# Patient Record
Sex: Female | Born: 1990 | Hispanic: No | Marital: Single | Smoking: Current every day smoker
Health system: Southern US, Community
[De-identification: ages and names within clinical notes are randomized; demographics above are authoritative.]

## PROBLEM LIST (undated history)

## (undated) HISTORY — PX: APPENDECTOMY: SHX54

---
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
## 2013-10-11 ENCOUNTER — Inpatient Hospital Stay (HOSPITAL_COMMUNITY)
Admission: EM | Admit: 2013-10-11 | Discharge: 2013-10-16 | DRG: 867 | Disposition: A | Discharge status: Home / Self Care | Payer: Medicaid - Out of State | Attending: Internal Medicine | Admitting: Internal Medicine

## 2013-10-11 ENCOUNTER — Emergency Department (HOSPITAL_COMMUNITY): Payer: Medicaid - Out of State

## 2013-10-11 ENCOUNTER — Encounter (HOSPITAL_COMMUNITY): Payer: Self-pay | Admitting: Emergency Medicine

## 2013-10-11 DIAGNOSIS — F17200 Nicotine dependence, unspecified, uncomplicated: Secondary | ICD-10-CM | POA: Diagnosis present

## 2013-10-11 DIAGNOSIS — L03119 Cellulitis of unspecified part of limb: Secondary | ICD-10-CM

## 2013-10-11 DIAGNOSIS — IMO0002 Reserved for concepts with insufficient information to code with codable children: Secondary | ICD-10-CM | POA: Diagnosis present

## 2013-10-11 DIAGNOSIS — E669 Obesity, unspecified: Secondary | ICD-10-CM | POA: Diagnosis present

## 2013-10-11 DIAGNOSIS — Z6841 Body Mass Index (BMI) 40.0 and over, adult: Secondary | ICD-10-CM

## 2013-10-11 DIAGNOSIS — A419 Sepsis, unspecified organism: Secondary | ICD-10-CM | POA: Diagnosis present

## 2013-10-11 DIAGNOSIS — L0390 Cellulitis, unspecified: Secondary | ICD-10-CM

## 2013-10-11 DIAGNOSIS — L0291 Cutaneous abscess, unspecified: Secondary | ICD-10-CM | POA: Diagnosis present

## 2013-10-11 DIAGNOSIS — A429 Actinomycosis, unspecified: Principal | ICD-10-CM | POA: Diagnosis present

## 2013-10-11 DIAGNOSIS — L03113 Cellulitis of right upper limb: Secondary | ICD-10-CM

## 2013-10-11 LAB — CBC WITH DIFFERENTIAL/PLATELET
BASOS PCT: 0 % (ref 0–1)
Basophils Absolute: 0 10*3/uL (ref 0.0–0.1)
Eosinophils Absolute: 0.1 10*3/uL (ref 0.0–0.7)
Eosinophils Relative: 1 % (ref 0–5)
HCT: 32.8 % — ABNORMAL LOW (ref 36.0–46.0)
Hemoglobin: 10.2 g/dL — ABNORMAL LOW (ref 12.0–15.0)
LYMPHS PCT: 30 % (ref 12–46)
Lymphs Abs: 4 10*3/uL (ref 0.7–4.0)
MCH: 25 pg — ABNORMAL LOW (ref 26.0–34.0)
MCHC: 31.1 g/dL (ref 30.0–36.0)
MCV: 80.4 fL (ref 78.0–100.0)
Monocytes Absolute: 0.9 10*3/uL (ref 0.1–1.0)
Monocytes Relative: 7 % (ref 3–12)
Neutro Abs: 8.5 10*3/uL — ABNORMAL HIGH (ref 1.7–7.7)
Neutrophils Relative %: 62 % (ref 43–77)
Platelets: 346 10*3/uL (ref 150–400)
RBC: 4.08 MIL/uL (ref 3.87–5.11)
RDW: 14.9 % (ref 11.5–15.5)
WBC: 13.5 10*3/uL — ABNORMAL HIGH (ref 4.0–10.5)

## 2013-10-11 LAB — I-STAT CG4 LACTIC ACID, ED: LACTIC ACID, VENOUS: 0.63 mmol/L (ref 0.5–2.2)

## 2013-10-11 MED ORDER — ACETAMINOPHEN 325 MG PO TABS
650.0000 mg | ORAL_TABLET | Freq: Four times a day (QID) | ORAL | Status: DC | PRN
Start: 2013-10-11 — End: 2013-10-16
  Administered 2013-10-11: 650 mg via ORAL
  Filled 2013-10-11: qty 2

## 2013-10-11 MED ORDER — HYDROMORPHONE HCL PF 1 MG/ML IJ SOLN
1.0000 mg | Freq: Once | INTRAMUSCULAR | Status: AC
  Administered 2013-10-11: 1 mg via INTRAVENOUS
  Filled 2013-10-11: qty 1

## 2013-10-11 MED ORDER — LIDOCAINE-EPINEPHRINE 1 %-1:100000 IJ SOLN
10.0000 mL | Freq: Once | INTRAMUSCULAR | Status: DC
  Filled 2013-10-11: qty 1

## 2013-10-11 NOTE — ED Notes (Signed)
Pt has large abscess under right arm x 1 week. Pt has 10/10 pain. Pt reports frequent chil.s. Denies N/V/D. AO x4.

## 2013-10-12 ENCOUNTER — Encounter (HOSPITAL_COMMUNITY): Payer: Self-pay | Admitting: *Deleted

## 2013-10-12 ENCOUNTER — Inpatient Hospital Stay (HOSPITAL_COMMUNITY): Payer: Medicaid - Out of State

## 2013-10-12 DIAGNOSIS — IMO0002 Reserved for concepts with insufficient information to code with codable children: Secondary | ICD-10-CM

## 2013-10-12 DIAGNOSIS — A429 Actinomycosis, unspecified: Secondary | ICD-10-CM | POA: Diagnosis present

## 2013-10-12 DIAGNOSIS — L0291 Cutaneous abscess, unspecified: Secondary | ICD-10-CM | POA: Diagnosis not present

## 2013-10-12 DIAGNOSIS — A419 Sepsis, unspecified organism: Secondary | ICD-10-CM | POA: Diagnosis present

## 2013-10-12 DIAGNOSIS — L0390 Cellulitis, unspecified: Secondary | ICD-10-CM | POA: Diagnosis not present

## 2013-10-12 DIAGNOSIS — F17200 Nicotine dependence, unspecified, uncomplicated: Secondary | ICD-10-CM | POA: Diagnosis present

## 2013-10-12 DIAGNOSIS — E669 Obesity, unspecified: Secondary | ICD-10-CM | POA: Diagnosis present

## 2013-10-12 DIAGNOSIS — L03119 Cellulitis of unspecified part of limb: Secondary | ICD-10-CM | POA: Diagnosis present

## 2013-10-12 DIAGNOSIS — Z6841 Body Mass Index (BMI) 40.0 and over, adult: Secondary | ICD-10-CM | POA: Diagnosis not present

## 2013-10-12 DIAGNOSIS — R509 Fever, unspecified: Secondary | ICD-10-CM | POA: Diagnosis present

## 2013-10-12 LAB — URINE MICROSCOPIC-ADD ON

## 2013-10-12 LAB — COMPREHENSIVE METABOLIC PANEL
ALK PHOS: 78 U/L (ref 39–117)
ALT: 11 U/L (ref 0–35)
AST: 14 U/L (ref 0–37)
Albumin: 3.1 g/dL — ABNORMAL LOW (ref 3.5–5.2)
BUN: 8 mg/dL (ref 6–23)
CHLORIDE: 100 meq/L (ref 96–112)
CO2: 23 mEq/L (ref 19–32)
Calcium: 9.1 mg/dL (ref 8.4–10.5)
Creatinine, Ser: 0.66 mg/dL (ref 0.50–1.10)
GFR calc non Af Amer: >90 mL/min (ref >90)
GLUCOSE: 103 mg/dL — AB (ref 70–99)
POTASSIUM: 3.9 meq/L (ref 3.7–5.3)
SODIUM: 138 meq/L (ref 137–147)
Total Bilirubin: <0.2 mg/dL — ABNORMAL LOW (ref 0.3–1.2)
Total Protein: 7.1 g/dL (ref 6.0–8.3)

## 2013-10-12 LAB — BASIC METABOLIC PANEL
BUN: 9 mg/dL (ref 6–23)
CHLORIDE: 98 meq/L (ref 96–112)
CO2: 22 meq/L (ref 19–32)
CREATININE: 0.65 mg/dL (ref 0.50–1.10)
Calcium: 9.4 mg/dL (ref 8.4–10.5)
GFR calc Af Amer: >90 mL/min (ref >90)
GFR calc non Af Amer: >90 mL/min (ref >90)
GLUCOSE: 105 mg/dL — AB (ref 70–99)
Potassium: 3.9 mEq/L (ref 3.7–5.3)
Sodium: 136 mEq/L — ABNORMAL LOW (ref 137–147)

## 2013-10-12 LAB — CBC WITH DIFFERENTIAL/PLATELET
Basophils Absolute: 0 10*3/uL (ref 0.0–0.1)
Basophils Relative: 0 % (ref 0–1)
EOS ABS: 0.1 10*3/uL (ref 0.0–0.7)
Eosinophils Relative: 1 % (ref 0–5)
HCT: 28.9 % — ABNORMAL LOW (ref 36.0–46.0)
Hemoglobin: 9.2 g/dL — ABNORMAL LOW (ref 12.0–15.0)
LYMPHS ABS: 2.9 10*3/uL (ref 0.7–4.0)
Lymphocytes Relative: 24 % (ref 12–46)
MCH: 25.6 pg — AB (ref 26.0–34.0)
MCHC: 31.8 g/dL (ref 30.0–36.0)
MCV: 80.5 fL (ref 78.0–100.0)
Monocytes Absolute: 0.9 10*3/uL (ref 0.1–1.0)
Monocytes Relative: 7 % (ref 3–12)
NEUTROS ABS: 8.2 10*3/uL — AB (ref 1.7–7.7)
NEUTROS PCT: 68 % (ref 43–77)
PLATELETS: 288 10*3/uL (ref 150–400)
RBC: 3.59 MIL/uL — ABNORMAL LOW (ref 3.87–5.11)
RDW: 15.2 % (ref 11.5–15.5)
WBC: 12.1 10*3/uL — ABNORMAL HIGH (ref 4.0–10.5)

## 2013-10-12 LAB — URINALYSIS, ROUTINE W REFLEX MICROSCOPIC
Bilirubin Urine: NEGATIVE
Glucose, UA: NEGATIVE mg/dL
Hgb urine dipstick: NEGATIVE
KETONES UR: 15 mg/dL — AB
Nitrite: NEGATIVE
PROTEIN: NEGATIVE mg/dL
Specific Gravity, Urine: 1.031 — ABNORMAL HIGH (ref 1.005–1.030)
UROBILINOGEN UA: 0.2 mg/dL (ref 0.0–1.0)
pH: 5.5 (ref 5.0–8.0)

## 2013-10-12 LAB — CK: Total CK: 98 U/L (ref 7–177)

## 2013-10-12 LAB — PROTIME-INR
INR: 1.15 (ref 0.00–1.49)
Prothrombin Time: 14.7 seconds (ref 11.6–15.2)

## 2013-10-12 LAB — PREGNANCY, URINE: Preg Test, Ur: NEGATIVE

## 2013-10-12 MED ORDER — HYDROMORPHONE HCL PF 1 MG/ML IJ SOLN
0.5000 mg | INTRAMUSCULAR | Status: DC | PRN
  Administered 2013-10-13 – 2013-10-14 (×8): 0.5 mg via INTRAVENOUS
  Administered 2013-10-14: 1 mg via INTRAVENOUS
  Administered 2013-10-15 (×2): 0.5 mg via INTRAVENOUS
  Filled 2013-10-12 (×12): qty 1

## 2013-10-12 MED ORDER — ACETAMINOPHEN 650 MG RE SUPP: 650.0000 mg | Freq: Four times a day (QID) | RECTAL | Status: DC | PRN

## 2013-10-12 MED ORDER — HYDROCODONE-ACETAMINOPHEN 5-325 MG PO TABS
1.0000 | ORAL_TABLET | ORAL | Status: DC | PRN
  Administered 2013-10-12 (×4): 2 via ORAL
  Filled 2013-10-12 (×4): qty 2

## 2013-10-12 MED ORDER — ACETAMINOPHEN 325 MG PO TABS: 650.0000 mg | ORAL_TABLET | Freq: Four times a day (QID) | ORAL | Status: DC | PRN

## 2013-10-12 MED ORDER — PIPERACILLIN-TAZOBACTAM 3.375 G IVPB
3.3750 g | Freq: Three times a day (TID) | INTRAVENOUS | Status: DC
  Administered 2013-10-12 – 2013-10-14 (×7): 3.375 g via INTRAVENOUS
  Filled 2013-10-12 (×11): qty 50

## 2013-10-12 MED ORDER — ENOXAPARIN SODIUM 60 MG/0.6ML ~~LOC~~ SOLN
60.0000 mg | SUBCUTANEOUS | Status: DC
  Filled 2013-10-12: qty 0.6

## 2013-10-12 MED ORDER — SODIUM CHLORIDE 0.9 % IV SOLN
Freq: Once | INTRAVENOUS | Status: AC
  Administered 2013-10-12: 01:00:00 via INTRAVENOUS

## 2013-10-12 MED ORDER — ONDANSETRON HCL 4 MG/2ML IJ SOLN: 4.0000 mg | Freq: Four times a day (QID) | INTRAMUSCULAR | Status: DC | PRN

## 2013-10-12 MED ORDER — SODIUM CHLORIDE 0.9 % IV SOLN
INTRAVENOUS | Status: DC
  Administered 2013-10-12 – 2013-10-14 (×3): via INTRAVENOUS

## 2013-10-12 MED ORDER — CLINDAMYCIN PHOSPHATE 600 MG/50ML IV SOLN
600.0000 mg | Freq: Once | INTRAVENOUS | Status: AC
  Administered 2013-10-12: 600 mg via INTRAVENOUS
  Filled 2013-10-12: qty 50

## 2013-10-12 MED ORDER — ENOXAPARIN SODIUM 40 MG/0.4ML ~~LOC~~ SOLN
40.0000 mg | Freq: Every day | SUBCUTANEOUS | Status: DC
  Administered 2013-10-12: 40 mg via SUBCUTANEOUS
  Filled 2013-10-12: qty 0.4

## 2013-10-12 MED ORDER — ONDANSETRON HCL 4 MG PO TABS: 4.0000 mg | ORAL_TABLET | Freq: Four times a day (QID) | ORAL | Status: DC | PRN

## 2013-10-12 MED ORDER — HYDROCODONE-ACETAMINOPHEN 5-325 MG PO TABS
1.0000 | ORAL_TABLET | ORAL | Status: DC | PRN
  Administered 2013-10-12 – 2013-10-15 (×10): 2 via ORAL
  Filled 2013-10-12 (×10): qty 2

## 2013-10-12 MED ORDER — HYDROMORPHONE HCL PF 1 MG/ML IJ SOLN
1.0000 mg | Freq: Once | INTRAMUSCULAR | Status: AC
Start: 2013-10-12 — End: 2013-10-12
  Administered 2013-10-12: 1 mg via INTRAVENOUS
  Filled 2013-10-12: qty 1

## 2013-10-12 MED ORDER — CLINDAMYCIN PHOSPHATE 600 MG/50ML IV SOLN
600.0000 mg | Freq: Three times a day (TID) | INTRAVENOUS | Status: DC
  Administered 2013-10-12: 600 mg via INTRAVENOUS
  Filled 2013-10-12 (×3): qty 50

## 2013-10-12 NOTE — ED Provider Notes (Signed)
CSN: [id number]     Arrival date & time 10/11/13  2242 History   First MD Initiated Contact with Patient 10/11/13 2307     Chief Complaint  Patient presents with  . Abscess     (Consider location/radiation/quality/duration/timing/severity/associated sxs/prior Treatment) HPI 23 year old female presents to emergency apartment from home with complaint of abscess under her right arm.  Swelling has been ongoing for the past week, but is worse in the last 2 days.  Area is exquisitely tender.  Patient with fever here tachycardia.  No prior history of any medical problems.  No medications.  Patient is a smoker.  She reports history of abscesses in the past that resolved on their own.  Patient has been applying a heating pad.  She reports that she has been wrapping a towel and applying abdominal more than 30 minutes at a time. History reviewed. No pertinent past medical history. Past Surgical History  Procedure Laterality Date  . Appendectomy     No family history on file. History  Substance Use Topics  . Smoking status: Current Every Day Smoker -- 0.50 packs/day    Types: Cigarettes  . Smokeless tobacco: Not on file  . Alcohol Use: No   OB History   Grav Para Term Preterm Abortions TAB SAB Ect Mult Living            1     Review of Systems  See History of Present Illness; otherwise all other systems are reviewed and negative   Allergies  Review of patient's allergies indicates no known allergies.  Home Medications   Prior to Admission medications   Medication Sig Start Date End Date Taking? Authorizing Provider  ibuprofen (ADVIL,MOTRIN) 200 MG tablet Take 600 mg by mouth 3 (three) times daily.   Yes Historical Provider, MD   BP 109/55  Pulse 140  Temp(Src) 101.8 F (38.8 C) (Rectal)  Resp 22  Ht 5\' 4"  (1.626 m)  SpO2 98%  LMP 10/02/2013 Physical Exam  Nursing note and vitals reviewed. Constitutional: She is oriented to person, place, and time. She appears well-developed  and well-nourished. She appears distressed.  Obese female, in acute pain tearful  HENT:  Head: Normocephalic and atraumatic.  Nose: Nose normal.  Mouth/Throat: Oropharynx is clear and moist.  Eyes: Conjunctivae and EOM are normal. Pupils are equal, round, and reactive to light.  Neck: Normal range of motion. Neck supple. No JVD present. No tracheal deviation present. No thyromegaly present.  Cardiovascular: Regular rhythm, normal heart sounds and intact distal pulses.  Exam reveals no gallop and no friction rub.   No murmur heard. Tachycardia  Pulmonary/Chest: Effort normal and breath sounds normal. No stridor. No respiratory distress. She has no wheezes. She has no rales. She exhibits no tenderness.  Abdominal: Soft. Bowel sounds are normal. She exhibits no distension and no mass. There is no tenderness. There is no rebound and no guarding.  Musculoskeletal: Normal range of motion. She exhibits edema and tenderness.  The patient has significant edema and tenderness to her right upper arm.  She has a 4" x 6" area of raised erythematous warm skin.  Bedside ultrasound shows no fluid collection  Lymphadenopathy:    She has no cervical adenopathy.  Neurological: She is alert and oriented to person, place, and time. She exhibits normal muscle tone. Coordination normal.  Skin: Skin is dry. No rash noted. No erythema. No pallor.  Psychiatric: She has a normal mood and affect. Her behavior is normal. Judgment and thought content  normal.    ED Course  Procedures (including critical care time) Labs Review Labs Reviewed  CBC WITH DIFFERENTIAL - Abnormal; Notable for the following:    WBC 13.5 (*)    Hemoglobin 10.2 (*)    HCT 32.8 (*)    MCH 25.0 (*)    Neutro Abs 8.5 (*)    All other components within normal limits  BASIC METABOLIC PANEL - Abnormal; Notable for the following:    Sodium 136 (*)    Glucose, Bld 105 (*)    All other components within normal limits  URINE CULTURE  CULTURE,  ROUTINE-ABSCESS  CULTURE, BLOOD (ROUTINE X 2)  CULTURE, BLOOD (ROUTINE X 2)  URINALYSIS, ROUTINE W REFLEX MICROSCOPIC  I-STAT CG4 LACTIC ACID, ED  POC URINE PREG, ED    Imaging Review No results found.   EKG Interpretation None      MDM   Final diagnoses:  Cellulitis of right upper extremity    23 year old female with an odd appearing cellulitic area to her right upper extremity.  No abscess noted to drain.  Pictures to be included in the chart.  Given that she is systemically ill, will start antibiotics and admit to the hospital for close monitoring.        Olivia Mackielga M Otter, MD 10/12/13 [phone]

## 2013-10-12 NOTE — Progress Notes (Signed)
ANTIBIOTIC CONSULT NOTE - INITIAL  Pharmacy Consult:  Zosyn Indication:  Cellulitis  No Known Allergies  Patient Measurements: Height: 5\' 4"  (162.6 cm) Weight: 253 lb 14.4 oz (115.168 kg) IBW/kg (Calculated) : 54.7  Vital Signs: Temp: 98.4 F (36.9 C) (06/25 0629) Temp src: Oral (06/25 0629) BP: 115/61 mmHg (06/25 0629) Pulse Rate: 102 (06/25 0629)  Labs:  Recent Labs  10/11/13 2313 10/12/13 0300  WBC 13.5* 12.1*  HGB 10.2* 9.2*  PLT 346 288  CREATININE 0.65 0.66   Estimated Creatinine Clearance: 136.2 ml/min (by C-G formula based on Cr of 0.66). No results found for this basename: VANCOTROUGH, VANCOPEAK, VANCORANDOM, GENTTROUGH, GENTPEAK, GENTRANDOM, TOBRATROUGH, TOBRAPEAK, TOBRARND, AMIKACINPEAK, AMIKACINTROU, AMIKACIN,  in the last 72 hours   Microbiology: No results found for this or any previous visit (from the past 720 hour(s)).  Medical History: History reviewed. No pertinent past medical history.    Assessment: [age] YOF with a one week history of right axilla area swelling and redness associated with fever and chills.  Pharmacy consulted to initiate Zosyn for cellulitis.  Baseline labs reviewed.   Goal of Therapy:  Clearance of infection   Plan:  - Zosyn 3.375gm IV Q8H, 4 hr infusion - Pharmacy will sign off as dosage adjustment likely unnecessary.  Thank you for the consult! - Change Lovenox to 60mg  SQ Q24H for VTE px in patient with BMI > 30    Delancey Moraes D. Laney Potashang, PharmD, BCPS Pager:  [phone] 10/12/2013, 11:10 AM

## 2013-10-12 NOTE — Progress Notes (Signed)
Utilization review completed.  

## 2013-10-12 NOTE — Progress Notes (Signed)
[facility] PROGRESS NOTE  Sheran SpineKia Kolodziej ZOX:[id number] DOB: Oct 04, 1990 DOA: 10/11/2013 PCP: No PCP Per Patient  Assessment/Plan: 23 y/o female presented with right axilla area swelling, erythema, associated with fever chills for one week. -denies any discharge drainage trauma injury, but reported h/o mild prior similar episode last year   1. Cellulitis R UE, sirs/sepsis; -started IV atx(changed to zosyn), f/u cultures; obtain US r/o abscess;    LMP ended on 15 June, denies being pregnant; pend pregnancy test   Code Status: full Family Communication:  Family at the bedside (indicate person spoken with, relationship, and if by phone, the number) Disposition Plan: home 2-3 days, pend clinical improvement    Consultants:  none  Procedures:  pedn US  Antibiotics:  clinda 6/25<<6/25  Zosyn 6/25<<<<<   (indicate start date, and stop date if known)  HPI/Subjective: alert  Objective: Filed Vitals:   10/12/13 0629  BP: 115/61  Pulse: 102  Temp: 98.4 F (36.9 C)  Resp: 20   No intake or output data in the 24 hours ending 10/12/13 1056 Filed Weights   10/12/13 0629  Weight: 115.168 kg (253 lb 14.4 oz)    Exam:   General:  alert  Cardiovascular: s1,s2 rrr  Respiratory: CTA BL  Abdomen: soft, nt,nd   Musculoskeletal: R axilla edema, erythema, ROM restricted due to pain   Data Reviewed: Basic Metabolic Panel:  Recent Labs Lab 10/11/13 2313 10/12/13 0300  NA 136* 138  K 3.9 3.9  CL 98 100  CO2 22 23  GLUCOSE 105* 103*  BUN 9 8  CREATININE 0.65 0.66  CALCIUM 9.4 9.1   Liver Function Tests:  Recent Labs Lab 10/12/13 0300  AST 14  ALT 11  ALKPHOS 78  BILITOT <0.2*  PROT 7.1  ALBUMIN 3.1*   No results found for this basename: LIPASE, AMYLASE,  in the last 168 hours No results found for this basename: AMMONIA,  in the last 168 hours CBC:  Recent Labs Lab 10/11/13 2313 10/12/13 0300  WBC 13.5* 12.1*  NEUTROABS 8.5* 8.2*  HGB  10.2* 9.2*  HCT 32.8* 28.9*  MCV 80.4 80.5  PLT 346 288   Cardiac Enzymes:  Recent Labs Lab 10/12/13 0300  CKTOTAL 98   BNP (last 3 results) No results found for this basename: PROBNP,  in the last 8760 hours CBG: No results found for this basename: GLUCAP,  in the last 168 hours  No results found for this or any previous visit (from the past 240 hour(s)).   Studies: No results found.  Scheduled Meds: . clindamycin (CLEOCIN) IV  600 mg Intravenous 3 times per day  . enoxaparin (LOVENOX) injection  40 mg Subcutaneous Daily   Continuous Infusions: . sodium chloride 100 mL/hr at 10/12/13 0749    Principal Problem:   Cellulitis of upper arm    Time spent: >35 minutes     Esperanza SheetsBURIEV, ULUGBEK N  [facility] Pager [phone]. If 7PM-7AM, please contact night-coverage at [email or web address], password [email or web address] 10/12/2013, 10:56 AM  LOS: 1 day

## 2013-10-12 NOTE — H&P (Signed)
[facility] History and Physical  Patient: Sierra Palmer  LKT:[id number]  DOB: [birth year]  DOS: the patient was seen and examined on 10/12/2013 PCP: No PCP Per Patient  Chief Complaint: Right axilla swelling  HPI: Sierra Palmer is a 23 y.o. female with no significant Past medical history. Patient presented with complaints of swelling that she has noted in her right axilla since last one week. She mentions she initially noted a node and later on she started having some pain in the same area and it was progressively worsening to the extent of current presentation. She denies any discharge drainage trauma injury or prior infection. She today he started having complaints of fever with chills and generalized fatigue and malaise and therefore, the hospital. She mentions once she had a nodule in the same spot which resolved on its own after taking over-the-counter pain medications. Last menstrual period ended on 15 June.  The patient is coming from home. And at her baseline independent for most of her ADL.  Review of Systems: as mentioned in the history of present illness.  A Comprehensive review of the other systems is negative.  History reviewed. No pertinent past medical history. Past Surgical History  Procedure Laterality Date  . Appendectomy     Social History:  reports that she has been smoking Cigarettes.  She has been smoking about 0.50 packs per day. She does not have any smokeless tobacco history on file. She reports that she does not drink alcohol. Her drug history is not on file.  No Known Allergies  History reviewed. No pertinent family history.  Prior to Admission medications   Medication Sig Start Date End Date Taking? Authorizing Provider  ibuprofen (ADVIL,MOTRIN) 200 MG tablet Take 600 mg by mouth 3 (three) times daily.   Yes Historical Provider, MD    Physical Exam: Filed Vitals:   10/11/13 2308 10/12/13 0130 10/12/13 0131 10/12/13 0218  BP:  90/38 106/66 113/54   Pulse:  113 115 110  Temp: 101.8 F (38.8 C)  100.2 F (37.9 C) 99.1 F (37.3 C)  TempSrc: Rectal  Oral Oral  Resp:   16 15  Height:      SpO2:  98% 99% 99%    General: Alert, Awake and Oriented to Time, Place and Person. Appear in mild distress Eyes: PERRL ENT: Oral Mucosa clear moist Neck: no JVD Cardiovascular: S1 and S2 Present, no Murmur, Peripheral Pulses Present Respiratory: Bilateral Air entry equal and Decreased, Clear to Auscultation,  no Crackles,no wheezes Abdomen: Bowel Sound Present, Soft and Non tender Skin: no Rash Extremities: Right axillary swelling with warmth and tenderness Limited range of motion at shoulder with pain in the lower axillary area No Pedal edema, no calf tenderness Neurologic: Grossly no focal neuro deficit.  Labs on Admission:  CBC:  Recent Labs Lab 10/11/13 2313 10/12/13 0300  WBC 13.5* 12.1*  NEUTROABS 8.5* 8.2*  HGB 10.2* 9.2*  HCT 32.8* 28.9*  MCV 80.4 80.5  PLT 346 288    CMP     Component Value Date/Time   NA 138 10/12/2013 0300   K 3.9 10/12/2013 0300   CL 100 10/12/2013 0300   CO2 23 10/12/2013 0300   GLUCOSE 103* 10/12/2013 0300   BUN 8 10/12/2013 0300   CREATININE 0.66 10/12/2013 0300   CALCIUM 9.1 10/12/2013 0300   PROT 7.1 10/12/2013 0300   ALBUMIN 3.1* 10/12/2013 0300   AST 14 10/12/2013 0300   ALT 11 10/12/2013 0300   ALKPHOS 78 10/12/2013 0300  BILITOT <0.2* 10/12/2013 0300   GFRNONAA >90 10/12/2013 0300   GFRAA >90 10/12/2013 0300    No results found for this basename: LIPASE, AMYLASE,  in the last 168 hours No results found for this basename: AMMONIA,  in the last 168 hours   Recent Labs Lab 10/12/13 0300  CKTOTAL 98   BNP (last 3 results) No results found for this basename: PROBNP,  in the last 8760 hours  Radiological Exams on Admission: No results found.   Assessment/Plan Principal Problem:   Cellulitis of upper arm   1. Cellulitis of upper arm Patient presents with complaints of significant  swelling in the right exit 81 with a temperature of 103. At present with his significant presentation the patient will be admitted to the hospital for observation. Blood cultures ESR CRP CPK. Ultrasound of the local labia for possible pus. IV clindamycin and IV hydration.  DVT Prophylaxis: subcutaneous Heparin Nutrition: Regular diet  Code Status: Full  Family Communication: Significant other was present at bedside, opportunity was given to ask question and all questions were answered satisfactorily at the time of interview. Disposition: Admitted to observation in med-surge unit.  Author: Pranav Patel, MD [facility] Pager: [phone] 10/12/2013, 5:53 AM    If 7PM-7AM, please contact night-coverage [email or web address] Password [email or web address]  **Disclaimer: This note may have been dictated with voice recognition software. Similar sounding words can inadvertently be transcribed and this note may contain transcription errors which may not have been corrected upon publication of note.**   

## 2013-10-13 ENCOUNTER — Encounter (HOSPITAL_COMMUNITY): Payer: Medicaid - Out of State | Admitting: Certified Registered Nurse Anesthetist

## 2013-10-13 ENCOUNTER — Encounter (HOSPITAL_COMMUNITY)
Admission: EM | Disposition: A | Discharge status: Home / Self Care | Payer: Self-pay | Source: Home / Self Care | Attending: Internal Medicine

## 2013-10-13 ENCOUNTER — Encounter (HOSPITAL_COMMUNITY): Payer: Self-pay | Admitting: General Surgery

## 2013-10-13 ENCOUNTER — Inpatient Hospital Stay (HOSPITAL_COMMUNITY): Payer: Medicaid - Out of State | Admitting: Certified Registered Nurse Anesthetist

## 2013-10-13 DIAGNOSIS — L0390 Cellulitis, unspecified: Secondary | ICD-10-CM

## 2013-10-13 DIAGNOSIS — IMO0002 Reserved for concepts with insufficient information to code with codable children: Secondary | ICD-10-CM

## 2013-10-13 DIAGNOSIS — L0291 Cutaneous abscess, unspecified: Secondary | ICD-10-CM

## 2013-10-13 DIAGNOSIS — A419 Sepsis, unspecified organism: Secondary | ICD-10-CM

## 2013-10-13 HISTORY — PX: IRRIGATION AND DEBRIDEMENT ABSCESS: SHX5252

## 2013-10-13 LAB — URINE CULTURE: Colony Count: 15000

## 2013-10-13 LAB — SURGICAL PCR SCREEN
MRSA, PCR: NEGATIVE
STAPHYLOCOCCUS AUREUS: NEGATIVE

## 2013-10-13 SURGERY — IRRIGATION AND DEBRIDEMENT ABSCESS
Anesthesia: General | Site: Axilla | Laterality: Right

## 2013-10-13 MED ORDER — ARTIFICIAL TEARS OP OINT
TOPICAL_OINTMENT | OPHTHALMIC | Status: DC | PRN
  Administered 2013-10-13: 1 via OPHTHALMIC

## 2013-10-13 MED ORDER — MIDAZOLAM HCL 2 MG/2ML IJ SOLN
INTRAMUSCULAR | Status: AC
  Filled 2013-10-13: qty 2

## 2013-10-13 MED ORDER — FENTANYL CITRATE 0.05 MG/ML IJ SOLN
INTRAMUSCULAR | Status: DC | PRN
  Administered 2013-10-13 (×5): 50 ug via INTRAVENOUS

## 2013-10-13 MED ORDER — PHENYLEPHRINE HCL 10 MG/ML IJ SOLN
INTRAMUSCULAR | Status: DC | PRN
  Administered 2013-10-13: 40 ug via INTRAVENOUS

## 2013-10-13 MED ORDER — LIDOCAINE HCL (CARDIAC) 20 MG/ML IV SOLN
INTRAVENOUS | Status: DC | PRN
  Administered 2013-10-13: 60 mg via INTRAVENOUS

## 2013-10-13 MED ORDER — BUPIVACAINE-EPINEPHRINE (PF) 0.25% -1:200000 IJ SOLN
INTRAMUSCULAR | Status: AC
  Filled 2013-10-13: qty 30

## 2013-10-13 MED ORDER — FENTANYL CITRATE 0.05 MG/ML IJ SOLN
INTRAMUSCULAR | Status: AC
  Filled 2013-10-13: qty 5

## 2013-10-13 MED ORDER — PROPOFOL 10 MG/ML IV BOLUS
INTRAVENOUS | Status: AC
  Filled 2013-10-13: qty 20

## 2013-10-13 MED ORDER — LIDOCAINE HCL (CARDIAC) 20 MG/ML IV SOLN
INTRAVENOUS | Status: AC
  Filled 2013-10-13: qty 5

## 2013-10-13 MED ORDER — 0.9 % SODIUM CHLORIDE (POUR BTL) OPTIME
TOPICAL | Status: DC | PRN
  Administered 2013-10-13: 1000 mL

## 2013-10-13 MED ORDER — LACTATED RINGERS IV SOLN
INTRAVENOUS | Status: DC | PRN
  Administered 2013-10-13: 11:00:00 via INTRAVENOUS

## 2013-10-13 MED ORDER — ENOXAPARIN SODIUM 60 MG/0.6ML ~~LOC~~ SOLN
60.0000 mg | SUBCUTANEOUS | Status: DC
  Administered 2013-10-14 – 2013-10-16 (×3): 60 mg via SUBCUTANEOUS
  Filled 2013-10-13 (×3): qty 0.6

## 2013-10-13 MED ORDER — ONDANSETRON HCL 4 MG/2ML IJ SOLN
INTRAMUSCULAR | Status: AC
  Filled 2013-10-13: qty 2

## 2013-10-13 MED ORDER — FENTANYL CITRATE 0.05 MG/ML IJ SOLN
INTRAMUSCULAR | Status: AC
  Filled 2013-10-13: qty 2

## 2013-10-13 MED ORDER — SUCCINYLCHOLINE CHLORIDE 20 MG/ML IJ SOLN
INTRAMUSCULAR | Status: DC | PRN
  Administered 2013-10-13: 120 mg via INTRAVENOUS

## 2013-10-13 MED ORDER — VANCOMYCIN HCL 10 G IV SOLR
1250.0000 mg | Freq: Three times a day (TID) | INTRAVENOUS | Status: DC
  Administered 2013-10-13 – 2013-10-14 (×5): 1250 mg via INTRAVENOUS
  Filled 2013-10-13 (×8): qty 1250

## 2013-10-13 MED ORDER — ROCURONIUM BROMIDE 50 MG/5ML IV SOLN
INTRAVENOUS | Status: AC
  Filled 2013-10-13: qty 1

## 2013-10-13 MED ORDER — MENTHOL 3 MG MT LOZG
1.0000 | LOZENGE | OROMUCOSAL | Status: DC | PRN
  Filled 2013-10-13: qty 9

## 2013-10-13 MED ORDER — PROPOFOL 10 MG/ML IV BOLUS
INTRAVENOUS | Status: DC | PRN
  Administered 2013-10-13: 160 mg via INTRAVENOUS

## 2013-10-13 MED ORDER — MIDAZOLAM HCL 5 MG/5ML IJ SOLN
INTRAMUSCULAR | Status: DC | PRN
  Administered 2013-10-13 (×2): 1 mg via INTRAVENOUS

## 2013-10-13 MED ORDER — ONDANSETRON HCL 4 MG/2ML IJ SOLN
INTRAMUSCULAR | Status: DC | PRN
  Administered 2013-10-13: 4 mg via INTRAVENOUS

## 2013-10-13 MED ORDER — FENTANYL CITRATE 0.05 MG/ML IJ SOLN
25.0000 ug | INTRAMUSCULAR | Status: DC | PRN
  Administered 2013-10-13 (×2): 50 ug via INTRAVENOUS

## 2013-10-13 SURGICAL SUPPLY — 55 items
APL SKNCLS STERI-STRIP NONHPOA (GAUZE/BANDAGES/DRESSINGS) ×1
BANDAGE GAUZE ELAST BULKY 4 IN (GAUZE/BANDAGES/DRESSINGS) IMPLANT
BENZOIN TINCTURE PRP APPL 2/3 (GAUZE/BANDAGES/DRESSINGS) ×2 IMPLANT
BLADE SURG 15 STRL LF DISP TIS (BLADE) IMPLANT
BLADE SURG 15 STRL SS (BLADE) ×3
CANISTER SUCTION 2500CC (MISCELLANEOUS) ×2 IMPLANT
CLEANER TIP ELECTROSURG 2X2 (MISCELLANEOUS) ×3 IMPLANT
COVER SURGICAL LIGHT HANDLE (MISCELLANEOUS) ×3 IMPLANT
DECANTER SPIKE VIAL GLASS SM (MISCELLANEOUS) IMPLANT
DRAPE EXTREMITY T 121X128X90 (DRAPE) IMPLANT
DRAPE LAPAROSCOPIC ABDOMINAL (DRAPES) ×3 IMPLANT
DRSG PAD ABDOMINAL 8X10 ST (GAUZE/BANDAGES/DRESSINGS) ×2 IMPLANT
ELECT REM PT RETURN 9FT ADLT (ELECTROSURGICAL) ×3
ELECTRODE REM PT RTRN 9FT ADLT (ELECTROSURGICAL) ×1 IMPLANT
GAUZE PACKING IODOFORM 1/4X15 (GAUZE/BANDAGES/DRESSINGS) ×3 IMPLANT
GAUZE SPONGE 4X4 16PLY XRAY LF (GAUZE/BANDAGES/DRESSINGS) ×2 IMPLANT
GLOVE BIO SURGEON STRL SZ7 (GLOVE) ×3 IMPLANT
GLOVE BIOGEL PI IND STRL 7.0 (GLOVE) IMPLANT
GLOVE BIOGEL PI IND STRL 7.5 (GLOVE) ×1 IMPLANT
GLOVE BIOGEL PI IND STRL 8 (GLOVE) IMPLANT
GLOVE BIOGEL PI INDICATOR 7.0 (GLOVE) ×2
GLOVE BIOGEL PI INDICATOR 7.5 (GLOVE) ×2
GLOVE BIOGEL PI INDICATOR 8 (GLOVE) ×2
GLOVE SURG SS PI 7.0 STRL IVOR (GLOVE) ×2 IMPLANT
GOWN STRL REUS W/ TWL LRG LVL3 (GOWN DISPOSABLE) ×2 IMPLANT
GOWN STRL REUS W/TWL LRG LVL3 (GOWN DISPOSABLE) ×6
KIT BASIN OR (CUSTOM PROCEDURE TRAY) ×3 IMPLANT
KIT ROOM TURNOVER OR (KITS) ×3 IMPLANT
NDL HYPO 25X1 1.5 SAFETY (NEEDLE) IMPLANT
NEEDLE HYPO 25X1 1.5 SAFETY (NEEDLE) IMPLANT
NS IRRIG 1000ML POUR BTL (IV SOLUTION) ×3 IMPLANT
PACK SURGICAL SETUP 50X90 (CUSTOM PROCEDURE TRAY) ×3 IMPLANT
PAD ARMBOARD 7.5X6 YLW CONV (MISCELLANEOUS) ×6 IMPLANT
PENCIL BUTTON HOLSTER BLD 10FT (ELECTRODE) ×3 IMPLANT
SOLUTION BETADINE 4OZ (MISCELLANEOUS) ×2 IMPLANT
SPECIMEN JAR SMALL (MISCELLANEOUS) ×1 IMPLANT
SPONGE GAUZE 4X4 12PLY (GAUZE/BANDAGES/DRESSINGS) IMPLANT
SPONGE GAUZE 4X4 12PLY STER LF (GAUZE/BANDAGES/DRESSINGS) ×2 IMPLANT
SPONGE LAP 18X18 X RAY DECT (DISPOSABLE) ×3 IMPLANT
STAPLER VISISTAT 35W (STAPLE) ×3 IMPLANT
SUT MNCRL AB 4-0 PS2 18 (SUTURE) ×2 IMPLANT
SUT VIC AB 3-0 SH 27 (SUTURE) ×3
SUT VIC AB 3-0 SH 27XBRD (SUTURE) IMPLANT
SWAB COLLECTION DEVICE MRSA (MISCELLANEOUS) ×2 IMPLANT
SYR BULB 3OZ (MISCELLANEOUS) ×3 IMPLANT
SYR CONTROL 10ML LL (SYRINGE) IMPLANT
TAPE CLOTH 4X10 WHT NS (GAUZE/BANDAGES/DRESSINGS) ×2 IMPLANT
TAPE CLOTH SURG 4X10 WHT LF (GAUZE/BANDAGES/DRESSINGS) ×2 IMPLANT
TOWEL OR 17X24 6PK STRL BLUE (TOWEL DISPOSABLE) ×3 IMPLANT
TOWEL OR 17X26 10 PK STRL BLUE (TOWEL DISPOSABLE) ×3 IMPLANT
TUBE ANAEROBIC SPECIMEN COL (MISCELLANEOUS) ×3 IMPLANT
TUBE CONNECTING 12'X1/4 (SUCTIONS) ×1
TUBE CONNECTING 12X1/4 (SUCTIONS) ×1 IMPLANT
WATER STERILE IRR 1000ML POUR (IV SOLUTION) IMPLANT
YANKAUER SUCT BULB TIP NO VENT (SUCTIONS) ×2 IMPLANT

## 2013-10-13 NOTE — Progress Notes (Signed)
[facility] PROGRESS NOTE  Sierra Palmer ZOX:[id number] DOB: 12-Oct-1990 DOA: 10/11/2013 PCP: No PCP Per Patient  Assessment/Plan: 23 y/o female presented with right axilla area swelling, erythema, associated with fever chills for one week. -denies any discharge drainage trauma injury, but reported h/o mild prior similar episode last year   1. Cellulitis R UE  with an abscess sirs/sepsis -US: 2.4 x 2.9 cm poorly defined fluid collection in the subcutaneous  fat of the medial aspect of the upper right arm is suspicious for potential abscess.  --started IV atx(changed to zosyn) added Vanc (6/26) due to +blood cultures 1/2 GPS; pend final results  --consulted surgery for I&D  LMP ended on 15 June, denies being pregnant; pend pregnancy test   Code Status: full Family Communication:  Family at the bedside (indicate person spoken with, relationship, and if by phone, the number) Disposition Plan: pend clinical improvement    Consultants:  none  Procedures:  pedn US  Antibiotics:  clinda 6/25<<6/25  Zosyn 6/25<<<<<  vanc 6/26<<<<   (indicate start date, and stop date if known)  HPI/Subjective: alert  Objective: Filed Vitals:   10/13/13 0522  BP: 121/70  Pulse: 106  Temp: 99.3 F (37.4 C)  Resp: 18    Intake/Output Summary (Last 24 hours) at 10/13/13 0803 Last data filed at 10/13/13 0500  Gross per 24 hour  Intake      0 ml  Output    800 ml  Net   -800 ml   Filed Weights   10/12/13 0629  Weight: 115.168 kg (253 lb 14.4 oz)    Exam:   General:  alert  Cardiovascular: s1,s2 rrr  Respiratory: CTA BL  Abdomen: soft, nt,nd   Musculoskeletal: R axilla edema, erythema, ROM restricted due to pain   Data Reviewed: Basic Metabolic Panel:  Recent Labs Lab 10/11/13 2313 10/12/13 0300  NA 136* 138  K 3.9 3.9  CL 98 100  CO2 22 23  GLUCOSE 105* 103*  BUN 9 8  CREATININE 0.65 0.66  CALCIUM 9.4 9.1   Liver Function Tests:  Recent Labs Lab  10/12/13 0300  AST 14  ALT 11  ALKPHOS 78  BILITOT <0.2*  PROT 7.1  ALBUMIN 3.1*   No results found for this basename: LIPASE, AMYLASE,  in the last 168 hours No results found for this basename: AMMONIA,  in the last 168 hours CBC:  Recent Labs Lab 10/11/13 2313 10/12/13 0300  WBC 13.5* 12.1*  NEUTROABS 8.5* 8.2*  HGB 10.2* 9.2*  HCT 32.8* 28.9*  MCV 80.4 80.5  PLT 346 288   Cardiac Enzymes:  Recent Labs Lab 10/12/13 0300  CKTOTAL 98   BNP (last 3 results) No results found for this basename: PROBNP,  in the last 8760 hours CBG: No results found for this basename: GLUCAP,  in the last 168 hours  Recent Results (from the past 240 hour(s))  CULTURE, BLOOD (ROUTINE X 2)     Status: None   Collection Time    10/11/13 11:15 PM      Result Value Ref Range Status   Specimen Description BLOOD LEFT ARM   Final   Special Requests BOTTLES DRAWN AEROBIC AND ANAEROBIC 5CC   Final   Culture  Setup Time     Final   Value: 10/12/2013 09:24     [facility]   Culture     Final   Value: GRAM POSITIVE COCCI IN CLUSTERS     Note: Gram Stain  Report Called to,Read Back By and Verified With: SOPHIA HAYFORD 10/13/13 AT 0200 RIDK     Performed at [facility]   Report Status PENDING   Incomplete     Studies: [place] Extrem Up Right Ltd  10/12/2013   CLINICAL DATA:  Pain and swelling in the right axilla.  EXAM: ULTRASOUND right UPPER EXTREMITY LIMITED  TECHNIQUE: Ultrasound examination of the upper extremity soft tissues was performed in the area of clinical concern.  COMPARISON:  No priors.  FINDINGS: In the inner aspect of the upper arm within the subcutaneous tissues there is a poorly defined area of hypoechogenicity that measures approximately 2.4 x 2.9 cm with some increased through transmission, which could represent abscess.  IMPRESSION: 1. 2.4 x 2.9 cm poorly defined fluid collection in the subcutaneous fat of the medial aspect of the upper right arm is  suspicious for potential abscess.   Electronically Signed   By: Trudie Reedaniel  Entrikin M.D.   On: 10/12/2013 18:16    Scheduled Meds: . enoxaparin (LOVENOX) injection  60 mg Subcutaneous Q24H  . piperacillin-tazobactam (ZOSYN)  IV  3.375 g Intravenous Q8H  . vancomycin  1,250 mg Intravenous Q8H   Continuous Infusions: . sodium chloride 100 mL/hr at 10/12/13 0749    Principal Problem:   Cellulitis of upper arm Active Problems:   Cellulitis and abscess    Time spent: >35 minutes     Esperanza SheetsBURIEV, ULUGBEK N  [facility] Pager [phone]. If 7PM-7AM, please contact night-coverage at [email or web address], password [email or web address] 10/13/2013, 8:03 AM  LOS: 2 days

## 2013-10-13 NOTE — Progress Notes (Signed)
Sierra Palmer, Sierra Palmer in with RU arm Cellulitis. Bld Cx result from 6/24 has positive gram cocci in clusters in the Aerobic bottle. Thanks.

## 2013-10-13 NOTE — Consult Note (Signed)
Agree with above, discussed i/d right axillary abscess today

## 2013-10-13 NOTE — Anesthesia Postprocedure Evaluation (Signed)
  Anesthesia Post-op Note  Patient: Sierra Palmer  Procedure(s) Performed: Procedure(s): IRRIGATION AND DEBRIDEMENT AXILLARY ABSCESS (Right)  Patient Location: PACU  Anesthesia Type:General  Level of Consciousness: awake  Airway and Oxygen Therapy: Patient Spontanous Breathing  Post-op Pain: mild  Post-op Assessment: Post-op Vital signs reviewed  Post-op Vital Signs: Reviewed  Last Vitals:  Filed Vitals:   10/13/13 0953  BP: 120/69  Pulse: 117  Temp: 37.6 C  Resp: 18    Complications: No apparent anesthesia complications

## 2013-10-13 NOTE — Op Note (Signed)
Preoperative diagnosis: right upper extremity abscess Postoperative diagnosis: same as above Procedure: incision and drainage of right upper extremity abscess 8x4 cm Surgeon: Dr Harden MoMatt Anna-Marie Coller Anesthesia general EBL: minimal Drains: none Specimen: cultures to micro Complications: none Sponge count correct dispo to pacu stable  Indications: This is a 23 year old female who is morbidly obese and is a smoker. She presented was admitted by the hospitalist service for a right upper extremity abscess. We were consulted this morning. On her exam she has a fluctuant tender right upper treatment he mass. I discussed going to the operating room and incising and draining this while leaving it open.  Procedure: After informed consent was obtained the patient was taken to the operating room. She was already on a regimen of vancomycin and Zosyn on the floor. Sequential compression devices were placed. She was placed under general anesthesia without complication. Her right upper extremity was then prepped and draped in the standard sterile surgical fashion. This abscess was very near her axilla on the medial aspect of her right upper arm.. A surgical timeout was then performed.  I made an elliptical incision overlying the abscess and remove the skin and the subcutaneous tissue. I then entered into an 8 x 4 cm cavity that was full of purulence. Cultures were taken. This was completely evacuated. I then irrigated this. I then packed this with iodoform gauze and placed a dressing. She tolerated this well was extubated and transferred to recovery stable.

## 2013-10-13 NOTE — Anesthesia Preprocedure Evaluation (Signed)
Anesthesia Evaluation  Patient identified by MRN, date of birth, ID band Patient awake    Reviewed: Allergy & Precautions, H&P , NPO status , Patient's Chart, lab work & pertinent test results, reviewed documented beta blocker date and time   Airway Mallampati: II TM Distance: >3 FB Neck ROM: Full    Dental  (+) Teeth Intact, Dental Advisory Given   Pulmonary          Cardiovascular     Neuro/Psych    GI/Hepatic   Endo/Other    Renal/GU      Musculoskeletal   Abdominal   Peds  Hematology   Anesthesia Other Findings   Reproductive/Obstetrics                           Anesthesia Physical Anesthesia Plan  ASA: II  Anesthesia Plan: General   Post-op Pain Management:    Induction: Intravenous  Airway Management Planned: Oral ETT  Additional Equipment:   Intra-op Plan:   Post-operative Plan: Extubation in OR  Informed Consent:   Dental advisory given  Plan Discussed with: CRNA and Anesthesiologist  Anesthesia Plan Comments:         Anesthesia Quick Evaluation

## 2013-10-13 NOTE — Progress Notes (Signed)
ANTIBIOTIC CONSULT NOTE - INITIAL  Pharmacy Consult for Vancomycin  Indication: Bacteremia   No Known Allergies  Patient Measurements: Height: 5\' 4"  (162.6 cm) Weight: 253 lb 14.4 oz (115.168 kg) IBW/kg (Calculated) : 54.7  Vital Signs: Temp: 99.3 F (37.4 C) (06/26 0522) Temp src: Oral (06/26 0522) BP: 121/70 mmHg (06/26 0522) Pulse Rate: 106 (06/26 0522)  Labs:  Recent Labs  10/11/13 2313 10/12/13 0300  WBC 13.5* 12.1*  HGB 10.2* 9.2*  PLT 346 288  CREATININE 0.65 0.66   Estimated Creatinine Clearance: 136.2 ml/min (by C-G formula based on Cr of 0.66).   Microbiology: Recent Results (from the past 720 hour(s))  CULTURE, BLOOD (ROUTINE X 2)     Status: None   Collection Time    10/11/13 11:15 PM      Result Value Ref Range Status   Specimen Description BLOOD LEFT ARM   Final   Special Requests BOTTLES DRAWN AEROBIC AND ANAEROBIC 5CC   Final   Culture  Setup Time     Final   Value: 10/12/2013 09:24     Performed at [facility]   Culture     Final   Value: GRAM POSITIVE COCCI IN CLUSTERS     Note: Gram Stain Report Called to,Read Back By and Verified With: SOPHIA HAYFORD 10/13/13 AT 0200 RIDK     Performed at [facility]   Report Status PENDING   Incomplete    Assessment: 23 y/o F already on Zosyn for cellulitis, now with bacteremia to start Vancomycin.   Goal of Therapy:  Vancomycin trough level 15-20 mcg/ml  Plan:  -Vancomycin 1250 mg IV q8h -Trend WBC, temp, renal function  -F/U culture for speciation/sensi  -Drug levels as steady state  Abran DukeLedford, James 10/13/2013,7:37 AM

## 2013-10-13 NOTE — Transfer of Care (Signed)
Immediate Anesthesia Transfer of Care Note  Patient: Sierra Palmer  Procedure(s) Performed: Procedure(s): IRRIGATION AND DEBRIDEMENT AXILLARY ABSCESS (Right)  Patient Location: PACU  Anesthesia Type:General  Level of Consciousness: awake, alert  and oriented  Airway & Oxygen Therapy: Patient Spontanous Breathing and Patient connected to nasal cannula oxygen  Post-op Assessment: Report given to PACU RN and Post -op Vital signs reviewed and stable  Post vital signs: Reviewed and stable  Complications: No apparent anesthesia complications

## 2013-10-13 NOTE — Consult Note (Signed)
Sierra Palmer 20-Dec-1990  [id number].   Requesting MD: Dr. Rowe Clack Chief Complaint/Reason for Consult: right axillary abscess HPI: This is a 23 yo obese black female who began having pain in her right axilla last Wednesday.  She tried warm compresses on it, but it continued to worsen.  She did not think she was running fevers at home.  She denies any other symptoms, except pain.  She presented to the [facility] last night where she was admitted.  She had an ultrasound that shows an abscess.  We have been asked to see for further treatment.  ROS: Please see HPI, otherwise all other systems have been reviewed and are negative  History reviewed. No pertinent family history.  History reviewed. No pertinent past medical history.  Past Surgical History  Procedure Laterality Date  . Appendectomy      oopherectomy was primary surgery, appy was incidental    Social History:  reports that she has been smoking Cigarettes.  She has been smoking about 0.50 packs per day. She does not have any smokeless tobacco history on file. She reports that she does not drink alcohol or use illicit drugs.  Allergies: No Known Allergies  Medications Prior to Admission  Medication Sig Dispense Refill  . ibuprofen (ADVIL,MOTRIN) 200 MG tablet Take 600 mg by mouth 3 (three) times daily.        Blood pressure 121/70, pulse 106, temperature 99.3 F (37.4 C), temperature source Oral, resp. rate 18, height 5' 4"  (1.626 m), weight 253 lb 14.4 oz (115.168 kg), last menstrual period 10/02/2013, SpO2 99.00%. Physical Exam: General: pleasant, obese black female who is laying in bed in NAD HEENT: head is normocephalic, atraumatic.  Sclera are noninjected.  PERRL.  Ears and nose without any masses or lesions.  Mouth is pink and moist Heart: regular rhythm, but tachycardic .  Normal s1,s2. No obvious murmurs, gallops, or rubs noted.  Palpable radial and pedal pulses bilaterally Lungs: CTAB, no wheezes, rhonchi, or rales  noted.  Respiratory effort nonlabored Abd: soft, NT, ND, +BS, no masses, hernias, or organomegaly MS: all 4 extremities are symmetrical with no cyanosis, clubbing, or edema.  She has a large area of erythema, induration on her right UE and axilla.  This is difficult to fully exam as the patient is so tender she can't raise her arm for [place] to completely evaluate. Skin: warm and dry with no masses, lesions, or rashes Psych: A&Ox3 with an appropriate affect.    Results for orders placed during the hospital encounter of 10/11/13 (from the past 48 hour(s))  CBC WITH DIFFERENTIAL     Status: Abnormal   Collection Time    10/11/13 11:13 PM      Result Value Ref Range   WBC 13.5 (*) 4.0 - 10.5 K/uL   RBC 4.08  3.87 - 5.11 MIL/uL   Hemoglobin 10.2 (*) 12.0 - 15.0 g/dL   HCT 32.8 (*) 36.0 - 46.0 %   MCV 80.4  78.0 - 100.0 fL   MCH 25.0 (*) 26.0 - 34.0 pg   MCHC 31.1  30.0 - 36.0 g/dL   RDW 14.9  11.5 - 15.5 %   Platelets 346  150 - 400 K/uL   Neutrophils Relative % 62  43 - 77 %   Neutro Abs 8.5 (*) 1.7 - 7.7 K/uL   Lymphocytes Relative 30  12 - 46 %   Lymphs Abs 4.0  0.7 - 4.0 K/uL   Monocytes Relative 7  3 - 12 %  Monocytes Absolute 0.9  0.1 - 1.0 K/uL   Eosinophils Relative 1  0 - 5 %   Eosinophils Absolute 0.1  0.0 - 0.7 K/uL   Basophils Relative 0  0 - 1 %   Basophils Absolute 0.0  0.0 - 0.1 K/uL  BASIC METABOLIC PANEL     Status: Abnormal   Collection Time    10/11/13 11:13 PM      Result Value Ref Range   Sodium 136 (*) 137 - 147 mEq/L   Potassium 3.9  3.7 - 5.3 mEq/L   Chloride 98  96 - 112 mEq/L   CO2 22  19 - 32 mEq/L   Glucose, Bld 105 (*) 70 - 99 mg/dL   BUN 9  6 - 23 mg/dL   Creatinine, Ser 0.65  0.50 - 1.10 mg/dL   Calcium 9.4  8.4 - 10.5 mg/dL   GFR calc non Af Amer >90  >90 mL/min   GFR calc Af Amer >90  >90 mL/min   Comment: (NOTE)     The eGFR has been calculated using the CKD EPI equation.     This calculation has not been validated in all clinical situations.      eGFR's persistently <90 mL/min signify possible Chronic Kidney     Disease.  CULTURE, BLOOD (ROUTINE X 2)     Status: None   Collection Time    10/11/13 11:15 PM      Result Value Ref Range   Specimen Description BLOOD LEFT ARM     Special Requests BOTTLES DRAWN AEROBIC AND ANAEROBIC 5CC     Culture  Setup Time       Value: 10/12/2013 09:24     Performed at [facility]   Culture       Value: GRAM POSITIVE COCCI IN CLUSTERS     Note: Gram Stain Report Called to,Read Back By and Verified With: SOPHIA HAYFORD 10/13/13 AT [address]     Performed at [facility]   Report Status PENDING    I-STAT CG4 LACTIC ACID, ED     Status: None   Collection Time    10/11/13 11:28 PM      Result Value Ref Range   Lactic Acid, Venous 0.63  0.5 - 2.2 mmol/L  CULTURE, BLOOD (ROUTINE X 2)     Status: None   Collection Time    10/12/13  1:06 AM      Result Value Ref Range   Specimen Description BLOOD RIGHT ARM     Special Requests BOTTLES DRAWN AEROBIC AND ANAEROBIC 6CC EACH     Culture  Setup Time       Value: 10/12/2013 09:23     Performed at [facility]   Culture       Value:        BLOOD CULTURE RECEIVED NO GROWTH TO DATE CULTURE WILL BE HELD FOR 5 DAYS BEFORE ISSUING A FINAL NEGATIVE REPORT     Performed at [facility]   Report Status PENDING    CBC WITH DIFFERENTIAL     Status: Abnormal   Collection Time    10/12/13  3:00 AM      Result Value Ref Range   WBC 12.1 (*) 4.0 - 10.5 K/uL   RBC 3.59 (*) 3.87 - 5.11 MIL/uL   Hemoglobin 9.2 (*) 12.0 - 15.0 g/dL   HCT 28.9 (*) 36.0 - 46.0 %   MCV 80.5  78.0 - 100.0 fL  MCH 25.6 (*) 26.0 - 34.0 pg   MCHC 31.8  30.0 - 36.0 g/dL   RDW 15.2  11.5 - 15.5 %   Platelets 288  150 - 400 K/uL   Neutrophils Relative % 68  43 - 77 %   Neutro Abs 8.2 (*) 1.7 - 7.7 K/uL   Lymphocytes Relative 24  12 - 46 %   Lymphs Abs 2.9  0.7 - 4.0 K/uL   Monocytes Relative 7  3 - 12 %   Monocytes Absolute 0.9  0.1 - 1.0 K/uL    Eosinophils Relative 1  0 - 5 %   Eosinophils Absolute 0.1  0.0 - 0.7 K/uL   Basophils Relative 0  0 - 1 %   Basophils Absolute 0.0  0.0 - 0.1 K/uL  COMPREHENSIVE METABOLIC PANEL     Status: Abnormal   Collection Time    10/12/13  3:00 AM      Result Value Ref Range   Sodium 138  137 - 147 mEq/L   Potassium 3.9  3.7 - 5.3 mEq/L   Chloride 100  96 - 112 mEq/L   CO2 23  19 - 32 mEq/L   Glucose, Bld 103 (*) 70 - 99 mg/dL   BUN 8  6 - 23 mg/dL   Creatinine, Ser 0.66  0.50 - 1.10 mg/dL   Calcium 9.1  8.4 - 10.5 mg/dL   Total Protein 7.1  6.0 - 8.3 g/dL   Albumin 3.1 (*) 3.5 - 5.2 g/dL   AST 14  0 - 37 U/L   ALT 11  0 - 35 U/L   Alkaline Phosphatase 78  39 - 117 U/L   Total Bilirubin <0.2 (*) 0.3 - 1.2 mg/dL   GFR calc non Af Amer >90  >90 mL/min   GFR calc Af Amer >90  >90 mL/min   Comment: (NOTE)     The eGFR has been calculated using the CKD EPI equation.     This calculation has not been validated in all clinical situations.     eGFR's persistently <90 mL/min signify possible Chronic Kidney     Disease.  CK     Status: None   Collection Time    10/12/13  3:00 AM      Result Value Ref Range   Total CK 98  7 - 177 U/L  PROTIME-INR     Status: None   Collection Time    10/12/13  3:00 AM      Result Value Ref Range   Prothrombin Time 14.7  11.6 - 15.2 seconds   INR 1.15  0.00 - 1.49  URINALYSIS, ROUTINE W REFLEX MICROSCOPIC     Status: Abnormal   Collection Time    10/12/13 11:54 AM      Result Value Ref Range   Color, Urine YELLOW  YELLOW   APPearance CLOUDY (*) CLEAR   Specific Gravity, Urine 1.031 (*) 1.005 - 1.030   pH 5.5  5.0 - 8.0   Glucose, UA NEGATIVE  NEGATIVE mg/dL   Hgb urine dipstick NEGATIVE  NEGATIVE   Bilirubin Urine NEGATIVE  NEGATIVE   Ketones, ur 15 (*) NEGATIVE mg/dL   Protein, ur NEGATIVE  NEGATIVE mg/dL   Urobilinogen, UA 0.2  0.0 - 1.0 mg/dL   Nitrite NEGATIVE  NEGATIVE   Leukocytes, UA SMALL (*) NEGATIVE  PREGNANCY, URINE     Status: None    Collection Time    10/12/13 11:54 AM  Result Value Ref Range   Preg Test, Ur NEGATIVE  NEGATIVE   Comment:            THE SENSITIVITY OF THIS     METHODOLOGY IS >20 mIU/mL.  URINE MICROSCOPIC-ADD ON     Status: Abnormal   Collection Time    10/12/13 11:54 AM      Result Value Ref Range   Squamous Epithelial / LPF MANY (*) RARE   WBC, UA 21-50  <3 WBC/hpf   RBC / HPF 0-2  <3 RBC/hpf   Bacteria, UA MANY (*) RARE   Urine-Other MUCOUS PRESENT     [place] Extrem Up Right Ltd  10/12/2013   CLINICAL DATA:  Pain and swelling in the right axilla.  EXAM: ULTRASOUND right UPPER EXTREMITY LIMITED  TECHNIQUE: Ultrasound examination of the upper extremity soft tissues was performed in the area of clinical concern.  COMPARISON:  No priors.  FINDINGS: In the inner aspect of the upper arm within the subcutaneous tissues there is a poorly defined area of hypoechogenicity that measures approximately 2.4 x 2.9 cm with some increased through transmission, which could represent abscess.  IMPRESSION: 1. 2.4 x 2.9 cm poorly defined fluid collection in the subcutaneous fat of the medial aspect of the upper right arm is suspicious for potential abscess.   Electronically Signed   By: Vinnie Langton M.D.   On: 10/12/2013 18:16       Assessment/Plan 1. Right axillary/RUE abscess and cellulitis 2. Tachycardia  Plan: 1. Agree with IV abx therapy. 2. Plan to go to OR today for I&D of abscess.  This has been d/w the patient and she understands and agrees with plan. 3. NPO   OSBORNE,KELLY E 10/13/2013, 9:39 AM Pager: [phone]

## 2013-10-13 NOTE — Anesthesia Procedure Notes (Signed)
Procedure Name: Intubation Date/Time: 10/13/2013 11:20 AM Performed by: Reine JustFLOWERS, ROKOSHI T Pre-anesthesia Checklist: Patient identified, Emergency Drugs available, Suction available, Patient being monitored and Timeout performed Patient Re-evaluated:Patient Re-evaluated prior to inductionOxygen Delivery Method: Circle system utilized and Simple face mask Preoxygenation: Pre-oxygenation with 100% oxygen Intubation Type: IV induction Ventilation: Mask ventilation without difficulty Laryngoscope Size: Mac and 4 Grade View: Grade II Tube type: Oral Tube size: 7.0 mm Number of attempts: 1 Airway Equipment and Method: Patient positioned with wedge pillow and Stylet Placement Confirmation: ETT inserted through vocal cords under direct vision,  positive ETCO2 and breath sounds checked- equal and bilateral Secured at: 22 cm Tube secured with: Tape Dental Injury: Teeth and Oropharynx as per pre-operative assessment

## 2013-10-13 NOTE — Anesthesia Postprocedure Evaluation (Signed)
  Anesthesia Post-op Note  Patient: Sierra Palmer  Procedure(s) Performed: Procedure(s): IRRIGATION AND DEBRIDEMENT AXILLARY ABSCESS (Right)  Patient Location: PACU  Anesthesia Type:General  Level of Consciousness: awake  Airway and Oxygen Therapy: Patient Spontanous Breathing  Post-op Pain: mild  Post-op Assessment: Post-op Vital signs reviewed  Post-op Vital Signs: Reviewed  Last Vitals:  Filed Vitals:   10/13/13 1215  BP: 124/76  Pulse: 114  Temp:   Resp: 31    Complications: No apparent anesthesia complications

## 2013-10-14 LAB — CULTURE, BLOOD (ROUTINE X 2)

## 2013-10-14 NOTE — Progress Notes (Signed)
Lab unable to find area to obtain am lab specimen, pt refused to have labs drawn d/t painful.  States maybe later

## 2013-10-14 NOTE — Progress Notes (Signed)
1 Day Post-Op R axillar abscess I&D Subjective: Reports significant pain with movement but ok when still  Objective: Vital signs in last 24 hours: Temp:  [97.8 F (36.6 C)-99.6 F (37.6 C)] 98 F (36.7 C) (06/27 0706) Pulse Rate:  [95-118] 107 (06/27 0706) Resp:  [13-31] 16 (06/27 0706) BP: (95-134)/(47-80) 118/70 mmHg (06/27 0706) SpO2:  [95 %-99 %] 98 % (06/27 0706)   Intake/Output from previous day: 06/26 0701 - 06/27 0700 In: 6148.3 [P.O.:480; I.V.:5668.3] Out: 900 [Urine:900] Intake/Output this shift:     General appearance: alert and cooperative  Incision: no surrounding erythema, tissue appears viable  Lab Results:   Recent Labs  10/11/13 2313 10/12/13 0300  WBC 13.5* 12.1*  HGB 10.2* 9.2*  HCT 32.8* 28.9*  PLT 346 288   BMET  Recent Labs  10/11/13 2313 10/12/13 0300  NA 136* 138  K 3.9 3.9  CL 98 100  CO2 22 23  GLUCOSE 105* 103*  BUN 9 8  CREATININE 0.65 0.66  CALCIUM 9.4 9.1   PT/INR  Recent Labs  10/12/13 0300  LABPROT 14.7  INR 1.15   ABG No results found for this basename: PHART, PCO2, PO2, HCO3,  in the last 72 hours  MEDS, Scheduled . enoxaparin (LOVENOX) injection  60 mg Subcutaneous Q24H  . piperacillin-tazobactam (ZOSYN)  IV  3.375 g Intravenous Q8H  . vancomycin  1,250 mg Intravenous Q8H    Studies/Results: [place] Extrem Up Right Ltd  10/12/2013   CLINICAL DATA:  Pain and swelling in the right axilla.  EXAM: ULTRASOUND right UPPER EXTREMITY LIMITED  TECHNIQUE: Ultrasound examination of the upper extremity soft tissues was performed in the area of clinical concern.  COMPARISON:  No priors.  FINDINGS: In the inner aspect of the upper arm within the subcutaneous tissues there is a poorly defined area of hypoechogenicity that measures approximately 2.4 x 2.9 cm with some increased through transmission, which could represent abscess.  IMPRESSION: 1. 2.4 x 2.9 cm poorly defined fluid collection in the subcutaneous fat of the medial  aspect of the upper right arm is suspicious for potential abscess.   Electronically Signed   By: Trudie Reedaniel  Entrikin M.D.   On: 10/12/2013 18:16    Assessment: s/p Procedure(s): IRRIGATION AND DEBRIDEMENT AXILLARY ABSCESS Patient Active Problem List   Diagnosis Date Noted  . Cellulitis of upper arm 10/12/2013  . Cellulitis and abscess 10/12/2013    S/p I&D R ax abscess  Plan: wet to dry dressing changes bid, will need premedication for changes Cont abx per primary team   LOS: 3 days     .Sierra PandaAlicia C Zeppelin Commisso, MD [facility], [place] [phone]   10/14/2013 9:25 AM

## 2013-10-14 NOTE — Progress Notes (Signed)
[facility] PROGRESS NOTE  Kriss Perleberg ZOX:[id number] DOB: 04-18-1991 DOA: 10/11/2013 PCP: No PCP Per Patient  Assessment/Plan: 23 y/o female presented with right axilla area swelling, erythema, associated with fever chills for one week. -denies trauma injury, but reported h/o mild prior similar episode last year   1. Cellulitis R UE  with an abscess sirs/sepsis -[place]: 2.4 x 2.9 cm poorly defined fluid collection in the subcutaneous  fat of the medial aspect of the upper right arm is suspicious for potential abscess.  -s/p I&D  (6/26): 8 x 4 cm cavity that was full of purulence -cont IV atx(changed to zosyn) added Vanc (6/26) due to +blood cultures 1/2 GPS; pend final results  appreciate surgery input    Code Status: full Family Communication:  Family at the bedside (indicate person spoken with, relationship, and if by phone, the number) Disposition Plan: pend clinical improvement    Consultants:  none  Procedures:  pedn [place]  Antibiotics:  clinda 6/25<<6/25  Zosyn 6/25<<<<<  vanc 6/26<<<<   (indicate start date, and stop date if known)  HPI/Subjective: alert  Objective: Filed Vitals:   10/14/13 0706  BP: 118/70  Pulse: 107  Temp: 98 F (36.7 C)  Resp: 16    Intake/Output Summary (Last 24 hours) at 10/14/13 1048 Last data filed at 10/14/13 1014  Gross per 24 hour  Intake 6388.33 ml  Output    600 ml  Net 5788.33 ml   Filed Weights   10/12/13 0629  Weight: 115.168 kg (253 lb 14.4 oz)    Exam:   General:  alert  Cardiovascular: s1,s2 rrr  Respiratory: CTA BL  Abdomen: soft, nt,nd   Musculoskeletal: R axilla edema, erythema, ROM restricted due to pain   Data Reviewed: Basic Metabolic Panel:  Recent Labs Lab 10/11/13 2313 10/12/13 0300  NA 136* 138  K 3.9 3.9  CL 98 100  CO2 22 23  GLUCOSE 105* 103*  BUN 9 8  CREATININE 0.65 0.66  CALCIUM 9.4 9.1   Liver Function Tests:  Recent Labs Lab 10/12/13 0300  AST 14  ALT 11   ALKPHOS 78  BILITOT <0.2*  PROT 7.1  ALBUMIN 3.1*   No results found for this basename: LIPASE, AMYLASE,  in the last 168 hours No results found for this basename: AMMONIA,  in the last 168 hours CBC:  Recent Labs Lab 10/11/13 2313 10/12/13 0300  WBC 13.5* 12.1*  NEUTROABS 8.5* 8.2*  HGB 10.2* 9.2*  HCT 32.8* 28.9*  MCV 80.4 80.5  PLT 346 288   Cardiac Enzymes:  Recent Labs Lab 10/12/13 0300  CKTOTAL 98   BNP (last 3 results) No results found for this basename: PROBNP,  in the last 8760 hours CBG: No results found for this basename: GLUCAP,  in the last 168 hours  Recent Results (from the past 240 hour(s))  CULTURE, BLOOD (ROUTINE X 2)     Status: None   Collection Time    10/11/13 11:15 PM      Result Value Ref Range Status   Specimen Description BLOOD LEFT ARM   Final   Special Requests BOTTLES DRAWN AEROBIC AND ANAEROBIC 5CC   Final   Culture  Setup Time     Final   Value: 10/12/2013 09:24     Performed at [facility]   Culture     Final   Value: STAPHYLOCOCCUS SPECIES (COAGULASE NEGATIVE)     Note: THE SIGNIFICANCE OF ISOLATING THIS ORGANISM FROM A SINGLE SET OF  BLOOD CULTURES WHEN MULTIPLE SETS ARE DRAWN IS UNCERTAIN. PLEASE NOTIFY THE MICROBIOLOGY DEPARTMENT WITHIN ONE WEEK IF SPECIATION AND SENSITIVITIES ARE REQUIRED.     Note: Gram Stain Report Called to,Read Back By and Verified With: SOPHIA HAYFORD 10/13/13 AT 0200 RIDK     [facility]   Report Status 10/14/2013 FINAL   Final  CULTURE, BLOOD (ROUTINE X 2)     Status: None   Collection Time    10/12/13  1:06 AM      Result Value Ref Range Status   Specimen Description BLOOD RIGHT ARM   Final   Special Requests BOTTLES DRAWN AEROBIC AND ANAEROBIC [facility]6CC EACH   Final   Culture  Setup Time     Final   Value: 10/12/2013 09:23     [facility] at [facility]   Culture     Final   Value:        BLOOD CULTURE RECEIVED NO GROWTH TO DATE CULTURE WILL BE HELD FOR 5 DAYS  BEFORE ISSUING A FINAL NEGATIVE REPORT     [facility]formed [facility]   Report Status PENDING   Incomplete  URINE CULTURE     Status: None   Collection Time    10/12/13 11:54 AM      Result Value Ref Range Status   Specimen Description URINE, CLEAN CATCH   Final   Special Requests NONE   Final   Culture  Setup Time     Final   Value: 10/12/2013 19:17     [facility]formed [facility]   Colony Count     Final   Value: 15,000 COLONIES/ML     [facility]formed at [facility]   Culture     Final   Value: Multiple bacterial morphotypes present, none predominant. Suggest appropriate recollection if clinically indicated.     Performed at [facility]   Report Status 10/13/2013 FINAL   Final  SURGICAL PCR SCREEN     Status: None   Collection Time    10/13/13 10:09 AM      Result Value Ref Range Status   MRSA, PCR NEGATIVE  NEGATIVE Final   Staphylococcus aureus NEGATIVE  NEGATIVE Final   Comment:            The Xpert SA Assay (FDA     approved for NASAL specimens     in patients over 10221 years of age),     is one component of     a comprehensive surveillance     program.  Test performance has     been validated by [facility] for patients greater     than or equal to 24 year old.     It is not intended     to diagnose infection nor to     guide or monitor treatment.  CULTURE, ROUTINE-ABSCESS     Status: None   Collection Time    10/13/13 11:30 AM      Result Value Ref Range Status   Specimen Description ABSCESS AXILLA RIGHT   Final   Special Requests PATIENT ON FOLLOWING ZOSYN   Final   Gram Stain     Final   Value: ABUNDANT WBC PRESENT,BOTH PMN AND MONONUCLEAR     NO SQUAMOUS EPITHELIAL CELLS SEEN     ABUNDANT GRAM POSITIVE COCCI IN PAIRS     Performed at [facility]   Culture     Final  Value: NO GROWTH 1 DAY     [facility]formed at [facility]   Report Status PENDING   Incomplete  ANAEROBIC CULTURE     Status: None   Collection Time     10/13/13 11:30 AM      Result Value Ref Range Status   Specimen Description ABSCESS AXILLA RIGHT   Final   Special Requests PATIENT ON FOLLOWING ZOSYN   Final   Gram Stain     Final   Value: ABUNDANT WBC PRESENT,BOTH PMN AND MONONUCLEAR     ABUNDANT GRAM POSITIVE COCCI IN PAIRS     Performed at [facility]   Culture PENDING   Incomplete   Report Status PENDING   Incomplete     Studies: [place] Extrem Up Right Ltd  10/12/2013   CLINICAL DATA:  Pain and swelling in the right axilla.  EXAM: ULTRASOUND right UPPER EXTREMITY LIMITED  TECHNIQUE: Ultrasound examination of the upper extremity soft tissues was performed in the area of clinical concern.  COMPARISON:  No priors.  FINDINGS: In the inner aspect of the upper arm within the subcutaneous tissues there is a poorly defined area of hypoechogenicity that measures approximately 2.4 x 2.9 cm with some increased through transmission, which could represent abscess.  IMPRESSION: 1. 2.4 x 2.9 cm poorly defined fluid collection in the subcutaneous fat of the medial aspect of the upper right arm is suspicious for potential abscess.   Electronically Signed   By: Trudie Reedaniel  Entrikin M.D.   On: 10/12/2013 18:16    Scheduled Meds: . enoxaparin (LOVENOX) injection  60 mg Subcutaneous Q24H  . piperacillin-tazobactam (ZOSYN)  IV  3.375 g Intravenous Q8H  . vancomycin  1,250 mg Intravenous Q8H   Continuous Infusions: . sodium chloride 125 mL/hr at 10/13/13 1407    Principal Problem:   Cellulitis of upper arm Active Problems:   Cellulitis and abscess    Time spent: >35 minutes     Esperanza SheetsBURIEV, ULUGBEK N  [facility] Pager [phone]. If 7PM-7AM, please contact night-coverage at [email or web address], password [email or web address] 10/14/2013, 10:48 AM  LOS: 3 days

## 2013-10-15 MED ORDER — HYDROMORPHONE HCL PF 1 MG/ML IJ SOLN: 0.5000 mg | Freq: Once | INTRAMUSCULAR | Status: AC

## 2013-10-15 MED ORDER — HYDROMORPHONE HCL PF 1 MG/ML IJ SOLN
1.0000 mg | Freq: Two times a day (BID) | INTRAMUSCULAR | Status: DC | PRN
  Administered 2013-10-15 (×2): 1 mg via INTRAMUSCULAR
  Filled 2013-10-15 (×2): qty 1

## 2013-10-15 MED ORDER — OXYCODONE-ACETAMINOPHEN 5-325 MG PO TABS
1.0000 | ORAL_TABLET | ORAL | Status: DC | PRN
  Administered 2013-10-15 – 2013-10-16 (×6): 2 via ORAL
  Filled 2013-10-15 (×6): qty 2

## 2013-10-15 MED ORDER — CEPHALEXIN 500 MG PO CAPS
500.0000 mg | ORAL_CAPSULE | Freq: Three times a day (TID) | ORAL | Status: DC
  Administered 2013-10-15 – 2013-10-16 (×4): 500 mg via ORAL
  Filled 2013-10-15 (×7): qty 1

## 2013-10-15 MED ORDER — DOXYCYCLINE HYCLATE 100 MG PO TABS
100.0000 mg | ORAL_TABLET | Freq: Two times a day (BID) | ORAL | Status: DC
  Administered 2013-10-15 – 2013-10-16 (×3): 100 mg via ORAL
  Filled 2013-10-15 (×4): qty 1

## 2013-10-15 NOTE — Progress Notes (Signed)
During packing removal and dressing and packing change pt very tearful, painful.  Did not want nurse to continue to re-pack.  Explained reasons and outcomes if wound not packed and dressed.  Pt upset, states pain medication "not enough".  MD paged

## 2013-10-15 NOTE — Progress Notes (Signed)
Paged MD to make aware of no IV access and how painful pt was during dressing change and that pt did not want packing to be put back in wound d/t pain.

## 2013-10-15 NOTE — Progress Notes (Addendum)
[facility] PROGRESS NOTE  Sheran SpineKia Vreeland ZOX:[id number] DOB: Mar 27, 1991 DOA: 10/11/2013 PCP: No PCP Per Patient  Assessment/Plan: 23 y/o female presented with right axilla area swelling, erythema, associated with fever chills for one week. -denies trauma injury, but reported h/o mild prior similar episode last year   1. Cellulitis R UE  with an abscess sirs/sepsis; sepsis resolved  -US: 2.4 x 2.9 cm poorly defined fluid collection in the subcutaneous  fat of the medial aspect of the upper right arm is suspicious for potential abscess.  -s/p I&D  (6/26): 8 x 4 cm cavity that was full of purulence; dressing changes per surgery  -blood cultures: 1/2 +STAPHYLOCOCCUS SPECIES (COAGULASE NEGATIVE -wound cultures: ABUNDANT GRAM POSITIVE COCCI IN PAIRS -on IV atx(zosyn) added Vanc);  6/28: Lost IV access; Pt is refusing IV; d/w ID Dr. Orvan Falconerampbell over the phone' -recommended to convert to Keflex 500 tid doxycycline 100 bid, f/u cultures; appreciate surgery, ID input    Code Status: full Family Communication:  Family at the bedside (indicate person spoken with, relationship, and if by phone, the number) Disposition Plan: pend surgical eval    Consultants:  none  Procedures:  pedn US  Antibiotics:  clinda 6/25<<6/25  Zosyn 6/25<<<<<  vanc 6/26<<<<   (indicate start date, and stop date if known)  HPI/Subjective: alert  Objective: Filed Vitals:   10/15/13 0653  BP: 102/59  Pulse: 103  Temp: 98.1 F (36.7 C)  Resp: 16    Intake/Output Summary (Last 24 hours) at 10/15/13 0945 Last data filed at 10/15/13 0654  Gross per 24 hour  Intake 3098.75 ml  Output      0 ml  Net 3098.75 ml   Filed Weights   10/12/13 0629  Weight: 115.168 kg (253 lb 14.4 oz)    Exam:   General:  alert  Cardiovascular: s1,s2 rrr  Respiratory: CTA BL  Abdomen: soft, nt,nd   Musculoskeletal: R axilla edema, erythema, ROM restricted due to pain   Data Reviewed: Basic Metabolic  Panel:  Recent Labs Lab 10/11/13 2313 10/12/13 0300  NA 136* 138  K 3.9 3.9  CL 98 100  CO2 22 23  GLUCOSE 105* 103*  BUN 9 8  CREATININE 0.65 0.66  CALCIUM 9.4 9.1   Liver Function Tests:  Recent Labs Lab 10/12/13 0300  AST 14  ALT 11  ALKPHOS 78  BILITOT <0.2*  PROT 7.1  ALBUMIN 3.1*   No results found for this basename: LIPASE, AMYLASE,  in the last 168 hours No results found for this basename: AMMONIA,  in the last 168 hours CBC:  Recent Labs Lab 10/11/13 2313 10/12/13 0300  WBC 13.5* 12.1*  NEUTROABS 8.5* 8.2*  HGB 10.2* 9.2*  HCT 32.8* 28.9*  MCV 80.4 80.5  PLT 346 288   Cardiac Enzymes:  Recent Labs Lab 10/12/13 0300  CKTOTAL 98   BNP (last 3 results) No results found for this basename: PROBNP,  in the last 8760 hours CBG: No results found for this basename: GLUCAP,  in the last 168 hours  Recent Results (from the past 240 hour(s))  CULTURE, BLOOD (ROUTINE X 2)     Status: None   Collection Time    10/11/13 11:15 PM      Result Value Ref Range Status   Specimen Description BLOOD LEFT ARM   Final   Special Requests BOTTLES DRAWN AEROBIC AND ANAEROBIC 5CC   Final   Culture  Setup Time     Final   Value: 10/12/2013  09:24     Performed at [facility]     Final   Value: STAPHYLOCOCCUS SPECIES (COAGULASE NEGATIVE)     Note: THE SIGNIFICANCE OF ISOLATING THIS ORGANISM FROM A SINGLE SET OF BLOOD CULTURES WHEN MULTIPLE SETS ARE DRAWN IS UNCERTAIN. PLEASE NOTIFY THE MICROBIOLOGY DEPARTMENT WITHIN ONE WEEK IF SPECIATION AND SENSITIVITIES ARE REQUIRED.     Note: Gram Stain Report Called to,Read Back By and Verified With: SOPHIA HAYFORD 10/13/13 AT 0200 RIDK     Performed at [facility]   Report Status 10/14/2013 FINAL   Final  CULTURE, BLOOD (ROUTINE X 2)     Status: None   Collection Time    10/12/13  1:06 AM      Result Value Ref Range Status   Specimen Description BLOOD RIGHT ARM   Final   Special Requests BOTTLES  DRAWN AEROBIC AND ANAEROBIC [facility] EACH   Final   Culture  Setup Time     Final   Value: 10/12/2013 09:23     Performed at [facility]   Culture     Final   Value:        BLOOD CULTURE RECEIVED NO GROWTH TO DATE CULTURE WILL BE HELD FOR 5 DAYS BEFORE ISSUING A FINAL NEGATIVE REPORT     Performed at [facility]   Report Status PENDING   Incomplete  URINE CULTURE     Status: None   Collection Time    10/12/13 11:54 AM      Result Value Ref Range Status   Specimen Description URINE, CLEAN CATCH   Final   Special Requests NONE   Final   Culture  Setup Time     Final   Value: 10/12/2013 19:17     Performed at [facility] Count     Final   Value: 15,000 COLONIES/ML     Performed at [facility]   Culture     Final   Value: Multiple bacterial morphotypes present, none predominant. Suggest appropriate recollection if clinically indicated.     Performed at [facility]   Report Status 10/13/2013 FINAL   Final  SURGICAL PCR SCREEN     Status: None   Collection Time    10/13/13 10:09 AM      Result Value Ref Range Status   MRSA, PCR NEGATIVE  NEGATIVE Final   Staphylococcus aureus NEGATIVE  NEGATIVE Final   Comment:            The Xpert SA Assay (FDA     approved for NASAL specimens     in patients over 17 years of age),     is one component of     a comprehensive surveillance     program.  Test performance has     been validated by [employer] for patients greater     than or equal to 19 year old.     It is not intended     to diagnose infection nor to     guide or monitor treatment.  CULTURE, ROUTINE-ABSCESS     Status: None   Collection Time    10/13/13 11:30 AM      Result Value Ref Range Status   Specimen Description ABSCESS AXILLA RIGHT   Final   Special Requests PATIENT ON FOLLOWING ZOSYN   Final   Gram Stain     Final   Value: ABUNDANT  WBC PRESENT,BOTH PMN AND MONONUCLEAR     NO SQUAMOUS EPITHELIAL CELLS SEEN      ABUNDANT GRAM POSITIVE COCCI IN PAIRS     [facility]   Culture     Final   Value: NO GROWTH 2 DAYS     [facility]   Report Status PENDING   Incomplete  ANAEROBIC CULTURE     Status: None   Collection Time    10/13/13 11:30 AM      Result Value Ref Range Status   Specimen Description ABSCESS AXILLA RIGHT   Final   Special Requests PATIENT ON FOLLOWING ZOSYN   Final   Gram Stain     Final   Value: ABUNDANT WBC PRESENT,BOTH PMN AND MONONUCLEAR     ABUNDANT GRAM POSITIVE COCCI IN PAIRS     [facility]   Culture     Final   Value: NO ANAEROBES ISOLATED; CULTURE IN PROGRESS FOR 5 DAYS     [facility]   Report Status PENDING   Incomplete     Studies: No results found.  Scheduled Meds: . enoxaparin (LOVENOX) injection  60 mg Subcutaneous Q24H  . piperacillin-tazobactam (ZOSYN)  IV  3.375 g Intravenous Q8H  . vancomycin  1,250 mg Intravenous Q8H   Continuous Infusions: . sodium chloride Stopped (10/15/13 0200)    Principal Problem:   Cellulitis of upper arm Active Problems:   Cellulitis and abscess    Time spent: >35 minutes     Esperanza SheetsBURIEV, ULUGBEK N  [facility] Pager [phone]. If 7PM-7AM, please contact night-coverage at [email or web address], password [email or web address] 10/15/2013, 9:45 AM  LOS: 4 days

## 2013-10-15 NOTE — Progress Notes (Signed)
Dressing changed, pt able to tolerate better with Percocet and IM dilaudid.

## 2013-10-15 NOTE — Progress Notes (Signed)
2 Days Post-Op R axillar abscess I&D Subjective: Reports less pain with movement, still having significant pain with dressing changes, unable to tolerate well with 0.5 mg Dilaudid prior to change  Objective: Vital signs in last 24 hours: Temp:  [98 F (36.7 C)-98.3 F (36.8 C)] 98.1 F (36.7 C) (06/28 0653) Pulse Rate:  [102-105] 103 (06/28 0653) Resp:  [16-17] 16 (06/28 0653) BP: (102-134)/(59-81) 102/59 mmHg (06/28 0653) SpO2:  [92 %-96 %] 92 % (06/28 0653)   Intake/Output from previous day: 06/27 0701 - 06/28 0700 In: 3098.8 [P.O.:1400; I.V.:1698.8] Out: -  Intake/Output this shift: Total I/O In: 360 [P.O.:360] Out: -    General appearance: alert and cooperative  Incision: no surrounding erythema, tissue appears viable  Lab Results:  No results found for this basename: WBC, HGB, HCT, PLT,  in the last 72 hours BMET No results found for this basename: NA, K, CL, CO2, GLUCOSE, BUN, CREATININE, CALCIUM,  in the last 72 hours PT/INR No results found for this basename: LABPROT, INR,  in the last 72 hours ABG No results found for this basename: PHART, PCO2, PO2, HCO3,  in the last 72 hours  MEDS, Scheduled . cephALEXin  500 mg Oral 3 times per day  . doxycycline  100 mg Oral Q12H  . enoxaparin (LOVENOX) injection  60 mg Subcutaneous Q24H    Studies/Results: No results found.  Assessment: s/p Procedure(s): IRRIGATION AND DEBRIDEMENT AXILLARY ABSCESS Patient Active Problem List   Diagnosis Date Noted  . Cellulitis of upper arm 10/12/2013  . Cellulitis and abscess 10/12/2013    S/p I&D R ax abscess  Plan: wet to dry dressing changes bid, will need premedication for changes, will give 1mg  Dilaudid IM prior to dressing changes Cont abx per primary team   LOS: 4 days     .Vanita PandaAlicia C Thomas, MD [facility], [place] [phone]   10/15/2013 9:58 AM

## 2013-10-15 NOTE — Progress Notes (Addendum)
Pt lost  IV access, will not flush.  IV team paged to assess.  Vancomycin not able to be administered.  IV team in room with pt, pt states she is going home today and to just leave the "IV out".  Pt at this time requested pain medication. It was explained by IV nurse and this nurse that without an IV she will be able to get IV pain medication, that it is too early for PO pain medication.  The IV nurse attempted to find IV site, pt told her to stop and she will wait to see if she is going home.  It was explained again that she would be able to have IV pain medication and her antibiotics are not being administered as they should.  Pt still said she will wait for the doctor.

## 2013-10-16 LAB — CULTURE, ROUTINE-ABSCESS: Culture: NO GROWTH

## 2013-10-16 MED ORDER — DOXYCYCLINE HYCLATE 100 MG PO TABS: 100.0000 mg | ORAL_TABLET | Freq: Two times a day (BID) | ORAL | Status: AC

## 2013-10-16 MED ORDER — OXYCODONE-ACETAMINOPHEN 5-325 MG PO TABS: 1.0000 | ORAL_TABLET | ORAL | Status: AC | PRN

## 2013-10-16 MED ORDER — CEPHALEXIN 500 MG PO CAPS: 500.0000 mg | ORAL_CAPSULE | Freq: Three times a day (TID) | ORAL | Status: AC

## 2013-10-16 NOTE — Discharge Instructions (Addendum)
Dressing Change A dressing is a material placed over wounds. It keeps the wound clean, dry, and protected from further injury. This provides an environment that favors wound healing.  BEFORE YOU BEGIN  Get your supplies together. Things you may need include:  Saline solution.  Flexible gauze dressing.  Medicated cream.  Tape.  Gloves.  Abdominal dressing pads.  Gauze squares.  Plastic bags.  Take pain medicine 30 minutes before the dressing change if you need it.  Take a shower before you do the first dressing change of the day. Use plastic wrap or a plastic bag to prevent the dressing from getting wet. REMOVING YOUR OLD DRESSING   Wash your hands with soap and water. Dry your hands with a clean towel.  Put on your gloves.  Remove any tape.  Carefully remove the old dressing. If the dressing sticks, you may dampen it with warm water to loosen it, or follow your caregiver's specific directions.  Remove any gauze or packing tape that is in your wound.  Take off your gloves.  Put the gloves, tape, gauze, or any packing tape into a plastic bag. CHANGING YOUR DRESSING  Open the supplies.  Take the cap off the saline solution.  Open the gauze package so that the gauze remains on the inside of the package.  Put on your gloves.  Clean your wound as told by your caregiver.  If you have been told to keep your wound dry, follow those instructions.  Your caregiver may tell you to do one or more of the following:  Pick up the gauze. Pour the saline solution over the gauze. Squeeze out the extra saline solution.  Put medicated cream or other medicine on your wound if you have been told to do so.  Put the solution soaked gauze only in your wound, not on the skin around it.  Pack your wound loosely or as told by your caregiver.  Put dry gauze on your wound.  Put abdominal dressing pads over the dry gauze if your wet gauze soaks through.  Tape the abdominal dressing  pads in place so they will not fall off. Do not wrap the tape completely around the affected part (arm, leg, abdomen).  Wrap the dressing pads with a flexible gauze dressing to secure it in place.  Take off your gloves. Put them in the plastic bag with the old dressing. Tie the bag shut and throw it away.  Keep the dressing clean and dry until your next dressing change.  Wash your hands. SEEK MEDICAL CARE IF:  Your skin around the wound looks red.  Your wound feels more tender or sore.  You see pus in the wound.  Your wound smells bad.  You have a fever.  Your skin around the wound has a rash that itches and burns.  You see black or yellow skin in your wound that was not there before.  You feel nauseous, throw up, and feel very tired. Document Released: 05/14/2004 Document Revised: 06/29/2011 Document Reviewed: 02/16/2011 [facility] Patient Information 2015 [place], [place]. This information is not intended to replace advice given to you by your health care provider. Make sure you discuss any questions you have with your health care provider.   +Abscess An abscess is an infected area that contains a collection of pus and debris.It can occur in almost any part of the body. An abscess is also known as a furuncle or boil. CAUSES  An abscess occurs when tissue gets infected. This  can occur from blockage of oil or sweat glands, infection of hair follicles, or a minor injury to the skin. As the body tries to fight the infection, pus collects in the area and creates pressure under the skin. This pressure causes pain. People with weakened immune systems have difficulty fighting infections and get certain abscesses more often.  SYMPTOMS Usually an abscess develops on the skin and becomes a painful mass that is red, warm, and tender. If the abscess forms under the skin, you may feel a moveable soft area under the skin. Some abscesses break open (rupture) on their own, but most will continue to  get worse without care. The infection can spread deeper into the body and eventually into the bloodstream, causing you to feel ill.  DIAGNOSIS  Your caregiver will take your medical history and perform a physical exam. A sample of fluid may also be taken from the abscess to determine what is causing your infection. TREATMENT  Your caregiver may prescribe antibiotic medicines to fight the infection. However, taking antibiotics alone usually does not cure an abscess. Your caregiver may need to make a small cut (incision) in the abscess to drain the pus. In some cases, gauze is packed into the abscess to reduce pain and to continue draining the area. HOME CARE INSTRUCTIONS   Only take over-the-counter or prescription medicines for pain, discomfort, or fever as directed by your caregiver.  If you were prescribed antibiotics, take them as directed. Finish them even if you start to feel better.  If gauze is used, follow your caregiver's directions for changing the gauze.  To avoid spreading the infection:  Keep your draining abscess covered with a bandage.  Wash your hands well.  Do not share personal care items, towels, or whirlpools with others.  Avoid skin contact with others.  Keep your skin and clothes clean around the abscess.  Keep all follow-up appointments as directed by your caregiver. SEEK MEDICAL CARE IF:   You have increased pain, swelling, redness, fluid drainage, or bleeding.  You have muscle aches, chills, or a general ill feeling.  You have a fever. MAKE SURE YOU:   Understand these instructions.  Will watch your condition.  Will get help right away if you are not doing well or get worse. Document Released: 01/14/2005 Document Revised: 10/06/2011 Document Reviewed: 06/19/2011 [facility] Patient Information 2015 [place], [place]. This information is not intended to replace advice given to you by your health care provider. Make sure you discuss any questions you have  with your health care provider.

## 2013-10-16 NOTE — Care Management Note (Signed)
  Page 1 of 1   10/16/2013     11:16:35 AM CARE MANAGEMENT NOTE 10/16/2013  Patient:  Sierra Palmer,Sierra Palmer   Account Number:  [id number]  Date Initiated:  10/16/2013  Documentation initiated by:  Ronny FlurryWILE,HEATHER  Subjective/Objective Assessment:     Action/Plan:   Anticipated DC Date:  10/16/2013   Anticipated DC Plan:  HOME W HOME HEALTH SERVICES         Choice offered to / List presented to:  C-1 Patient           Status of service:   Medicare Important Message given?   (If response is "NO", the following Medicare IM given date fields will be blank) Date Medicare IM given:   Date Additional Medicare IM given:    Discharge Disposition:    Per UR Regulation:    If discussed at Long Length of Stay Meetings, dates discussed:    Comments:  10-16-13 Patient  confirmed face sheet information . Home phone is [phone], Mother 's ( Contina Pouncy ) cell phone is [phone].  [facility] , [facility] , unable to take referral due [place] and [place] .  [facility] .  They would have to see her as unisure and bill her , each visit costing $120. Suggested bedside nurse show the patient's mother how to do dressing change before discharge.  Explained above to patient . She does not want home health now , and would like bedside nurse to show her mother how to do dressing change.  Bedside nurse , Rakita  in agreement . Pages Barnetta ChapelKelly Osborne PA to make her aware.  Ronny FlurryHeather Wile RN BSN [phone]

## 2013-10-16 NOTE — Discharge Summary (Signed)
Physician Discharge Summary  Sierra Palmer ZOX:[id number] DOB: 06-02-1990 DOA: 10/11/2013  PCP: No PCP Per Patient  Admit date: 10/11/2013 Discharge date: 10/16/2013  Time spent: >35 minutes  Recommendations for Outpatient Follow-up:  HHC, wound care F/u with surgery in 1 week  Discharge Diagnoses:  Principal Problem:   Cellulitis of upper arm Active Problems:   Cellulitis and abscess   Discharge Condition: stable   Diet recommendation: regular   Filed Weights   10/12/13 0629  Weight: 115.168 kg (253 lb 14.4 oz)    History of present illness:  23 y/o female presented with right axilla area swelling, erythema, associated with fever chills for one week.  -denies trauma injury, but reported h/o mild prior similar episode last year    Hospital Course:  1. Cellulitis R UE with an abscess sirs/sepsis; sepsis resolved  -[place]: 2.4 x 2.9 cm poorly defined fluid collection in the subcutaneous fat of the medial aspect of the upper right arm is suspicious for potential abscess.  -s/p I&D (6/26): 8 x 4 cm cavity that was full of purulence; dressing changes per surgery  -blood cultures: 1/2 +STAPHYLOCOCCUS SPECIES (COAGULASE NEGATIVE  -wound cultures: ABUNDANT GRAM POSITIVE COCCI IN PAIRS  -Pt was on IV atx(zosyn) added Vanc) untill 6/28: Lost IV access; Pt is refusing IV; d/w ID Dr. Orvan Falconer over the phone' -recommended to convert to Keflex 500 tid doxycycline 100 bid, f/u cultures; appreciate surgery, ID input  -clinically improved, afebrile, HHC/dressing change upon discharge f/u with surgery as OP         Procedures:  I&D  (i.e. Studies not automatically included, echos, thoracentesis, etc; not x-rays)  Consultations:  Surgery   Discharge Exam: Filed Vitals:   10/16/13 0521  BP: 128/86  Pulse: 78  Temp: 98.2 F (36.8 C)  Resp: 16    General: alert Cardiovascular: s1,s2 rrr Respiratory: CAT BL  Discharge Instructions  Discharge Instructions   Diet - low sodium  heart healthy    Complete by:  As directed      Discharge instructions    Complete by:  As directed   Please follow up with surgery in 1 week Please follow up with primary care doctor in 1 week     Increase activity slowly    Complete by:  As directed             Medication List         cephALEXin 500 MG capsule  Commonly known as:  KEFLEX  Take 1 capsule (500 mg total) by mouth every 8 (eight) hours.     doxycycline 100 MG tablet  Commonly known as:  VIBRA-TABS  Take 1 tablet (100 mg total) by mouth every 12 (twelve) hours.     ibuprofen 200 MG tablet  Commonly known as:  ADVIL,MOTRIN  Take 600 mg by mouth 3 (three) times daily.     oxyCODONE-acetaminophen 5-325 MG per tablet  Commonly known as:  PERCOCET/ROXICET  Take 1-2 tablets by mouth every 4 (four) hours as needed for severe pain.       No Known Allergies     Follow-up Information   Follow up with [facility]    . Schedule an appointment as soon as possible for a visit in 1 week.   Contact information:   [address] [place] [address] [phone]      Follow up with [facility] In 3 days.   Contact information:   [address]  [address] [place] [id number] [phone]       The results of significant diagnostics from this hospitalization (including imaging, microbiology, ancillary and laboratory) are listed below for reference.    Significant Diagnostic Studies: [place] Extrem Up Right [employer]  10/12/2013   CLINICAL DATA:  Pain and swelling in the right axilla.  EXAM: ULTRASOUND right UPPER EXTREMITY LIMITED  TECHNIQUE: Ultrasound examination of the upper extremity soft tissues was performed in the area of clinical concern.  COMPARISON:  No priors.  FINDINGS: In the inner aspect of the upper arm within the subcutaneous tissues there is a poorly defined area of hypoechogenicity that measures approximately 2.4 x 2.9 cm with some increased  through transmission, which could represent abscess.  IMPRESSION: 1. 2.4 x 2.9 cm poorly defined fluid collection in the subcutaneous fat of the medial aspect of the upper right arm is suspicious for potential abscess.   Electronically Signed   By: Trudie Reedaniel  Entrikin M.D.   On: 10/12/2013 18:16    Microbiology: Recent Results (from the past 240 hour(s))  CULTURE, BLOOD (ROUTINE X 2)     Status: None   Collection Time    10/11/13 11:15 PM      Result Value Ref Range Status   Specimen Description BLOOD LEFT ARM   Final   Special Requests BOTTLES DRAWN AEROBIC AND ANAEROBIC 5CC   Final   Culture  Setup Time     Final   Value: 10/12/2013 09:24     Performed at [facility]   Culture     Final   Value: STAPHYLOCOCCUS SPECIES (COAGULASE NEGATIVE)     Note: THE SIGNIFICANCE OF ISOLATING THIS ORGANISM FROM A SINGLE SET OF BLOOD CULTURES WHEN MULTIPLE SETS ARE DRAWN IS UNCERTAIN. PLEASE NOTIFY THE MICROBIOLOGY DEPARTMENT WITHIN ONE WEEK IF SPECIATION AND SENSITIVITIES ARE REQUIRED.     Note: Gram Stain Report Called to,Read Back By and Verified With: SOPHIA HAYFORD 10/13/13 AT 0200 RIDK     Performed at [facility]   Report Status 10/14/2013 FINAL   Final  CULTURE, BLOOD (ROUTINE X 2)     Status: None   Collection Time    10/12/13  1:06 AM      Result Value Ref Range Status   Specimen Description BLOOD RIGHT ARM   Final   Special Requests BOTTLES DRAWN AEROBIC AND ANAEROBIC [facility]6CC EACH   Final   Culture  Setup Time     Final   Value: 10/12/2013 09:23     Performed at [facility]   Culture     Final   Value:        BLOOD CULTURE RECEIVED NO GROWTH TO DATE CULTURE WILL BE HELD FOR 5 DAYS BEFORE ISSUING A FINAL NEGATIVE REPORT     Performed at [facility]   Report Status PENDING   Incomplete  URINE CULTURE     Status: None   Collection Time    10/12/13 11:54 AM      Result Value Ref Range Status   Specimen Description URINE, CLEAN CATCH   Final   Special  Requests NONE   Final   Culture  Setup Time     Final   Value: 10/12/2013 19:17     Performed at [facility]   Colony Count     Final   Value: 15,000 COLONIES/ML     Performed at [facility]   Culture     Final   Value: Multiple  bacterial morphotypes present, none predominant. Suggest appropriate recollection if clinically indicated.     Performed at [facility]   Report Status 10/13/2013 FINAL   Final  SURGICAL PCR SCREEN     Status: None   Collection Time    10/13/13 10:09 AM      Result Value Ref Range Status   MRSA, PCR NEGATIVE  NEGATIVE Final   Staphylococcus aureus NEGATIVE  NEGATIVE Final   Comment:            The Xpert SA Assay (FDA     approved for NASAL specimens     in patients over 38 years of age),     is one component of     a comprehensive surveillance     program.  Test performance has     been validated by [employer] for patients greater     than or equal to 46 year old.     It is not intended     to diagnose infection nor to     guide or monitor treatment.  CULTURE, ROUTINE-ABSCESS     Status: None   Collection Time    10/13/13 11:30 AM      Result Value Ref Range Status   Specimen Description ABSCESS AXILLA RIGHT   Final   Special Requests PATIENT ON FOLLOWING ZOSYN   Final   Gram Stain     Final   Value: ABUNDANT WBC PRESENT,BOTH PMN AND MONONUCLEAR     NO SQUAMOUS EPITHELIAL CELLS SEEN     ABUNDANT GRAM POSITIVE COCCI IN PAIRS     Performed at [facility]   Culture     Final   Value: NO GROWTH 3 DAYS     Performed at [facility]   Report Status 10/16/2013 FINAL   Final  ANAEROBIC CULTURE     Status: None   Collection Time    10/13/13 11:30 AM      Result Value Ref Range Status   Specimen Description ABSCESS AXILLA RIGHT   Final   Special Requests PATIENT ON FOLLOWING ZOSYN   Final   Gram Stain     Final   Value: ABUNDANT WBC PRESENT,BOTH PMN AND MONONUCLEAR     ABUNDANT GRAM POSITIVE COCCI IN  PAIRS     Performed at [facility]   Culture     Final   Value: NO ANAEROBES ISOLATED; CULTURE IN PROGRESS FOR 5 DAYS     Performed at [facility]   Report Status PENDING   Incomplete     Labs: Basic Metabolic Panel:  Recent Labs Lab 10/11/13 2313 10/12/13 0300  NA 136* 138  K 3.9 3.9  CL 98 100  CO2 22 23  GLUCOSE 105* 103*  BUN 9 8  CREATININE 0.65 0.66  CALCIUM 9.4 9.1   Liver Function Tests:  Recent Labs Lab 10/12/13 0300  AST 14  ALT 11  ALKPHOS 78  BILITOT <0.2*  PROT 7.1  ALBUMIN 3.1*   No results found for this basename: LIPASE, AMYLASE,  in the last 168 hours No results found for this basename: AMMONIA,  in the last 168 hours CBC:  Recent Labs Lab 10/11/13 2313 10/12/13 0300  WBC 13.5* 12.1*  NEUTROABS 8.5* 8.2*  HGB 10.2* 9.2*  HCT 32.8* 28.9*  MCV 80.4 80.5  PLT 346 288   Cardiac Enzymes:  Recent Labs Lab 10/12/13 0300  CKTOTAL 98   BNP: BNP (last  3 results) No results found for this basename: PROBNP,  in the last 8760 hours CBG: No results found for this basename: GLUCAP,  in the last 168 hours     Signed:  Esperanza SheetsBURIEV, ULUGBEK N  [facility] 10/16/2013, 10:13 AM

## 2013-10-16 NOTE — Progress Notes (Signed)
[facility] PROGRESS NOTE  Sierra SpineKia Palmer ZOX:[id number] DOB: 07-Nov-1990 DOA: 10/11/2013 PCP: No PCP Per Patient  Assessment/Plan: 23 y/o female presented with right axilla area swelling, erythema, associated with fever chills for one week. -denies trauma injury, but reported h/o mild prior similar episode last year   1. Cellulitis R UE  with an abscess sirs/sepsis; sepsis resolved  -US: 2.4 x 2.9 cm poorly defined fluid collection in the subcutaneous  fat of the medial aspect of the upper right arm is suspicious for potential abscess.  -s/p I&D  (6/26): 8 x 4 cm cavity that was full of purulence; dressing changes per surgery  -blood cultures: 1/2 +STAPHYLOCOCCUS SPECIES (COAGULASE NEGATIVE -wound cultures: ABUNDANT GRAM POSITIVE COCCI IN PAIRS -on IV atx(zosyn) added Vanc);  6/28: Lost IV access; Pt is refusing IV; d/w ID Dr. Orvan Falconerampbell over the phone' -recommended to convert to Keflex 500 tid doxycycline 100 bid, f/u cultures; appreciate surgery, ID input   Possible d/c today after dressing change; HHC upon d/c   Code Status: full Family Communication:  Family at the bedside (indicate person spoken with, relationship, and if by phone, the number) Disposition Plan: pend surgical eval    Consultants:  none  Procedures:    Antibiotics:  clinda 6/25<<6/25  Zosyn 6/25<<<<<  vanc 6/26<<<<   (indicate start date, and stop date if known)  HPI/Subjective: alert  Objective: Filed Vitals:   10/16/13 0521  BP: 128/86  Pulse: 78  Temp: 98.2 F (36.8 C)  Resp: 16    Intake/Output Summary (Last 24 hours) at 10/16/13 1007 Last data filed at 10/16/13 0600  Gross per 24 hour  Intake    922 ml  Output      0 ml  Net    922 ml   Filed Weights   10/12/13 0629  Weight: 115.168 kg (253 lb 14.4 oz)    Exam:   General:  alert  Cardiovascular: s1,s2 rrr  Respiratory: CTA BL  Abdomen: soft, nt,nd   Musculoskeletal: R axilla edema, erythema, ROM restricted due to  pain   Data Reviewed: Basic Metabolic Panel:  Recent Labs Lab 10/11/13 2313 10/12/13 0300  NA 136* 138  K 3.9 3.9  CL 98 100  CO2 22 23  GLUCOSE 105* 103*  BUN 9 8  CREATININE 0.65 0.66  CALCIUM 9.4 9.1   Liver Function Tests:  Recent Labs Lab 10/12/13 0300  AST 14  ALT 11  ALKPHOS 78  BILITOT <0.2*  PROT 7.1  ALBUMIN 3.1*   No results found for this basename: LIPASE, AMYLASE,  in the last 168 hours No results found for this basename: AMMONIA,  in the last 168 hours CBC:  Recent Labs Lab 10/11/13 2313 10/12/13 0300  WBC 13.5* 12.1*  NEUTROABS 8.5* 8.2*  HGB 10.2* 9.2*  HCT 32.8* 28.9*  MCV 80.4 80.5  PLT 346 288   Cardiac Enzymes:  Recent Labs Lab 10/12/13 0300  CKTOTAL 98   BNP (last 3 results) No results found for this basename: PROBNP,  in the last 8760 hours CBG: No results found for this basename: GLUCAP,  in the last 168 hours  Recent Results (from the past 240 hour(s))  CULTURE, BLOOD (ROUTINE X 2)     Status: None   Collection Time    10/11/13 11:15 PM      Result Value Ref Range Status   Specimen Description BLOOD LEFT ARM   Final   Special Requests BOTTLES DRAWN AEROBIC AND ANAEROBIC 5CC   Final  Culture  Setup Time     Final   Value: 10/12/2013 09:24     Performed at [facility]   Culture     Final   Value: STAPHYLOCOCCUS SPECIES (COAGULASE NEGATIVE)     Note: THE SIGNIFICANCE OF ISOLATING THIS ORGANISM FROM A SINGLE SET OF BLOOD CULTURES WHEN MULTIPLE SETS ARE DRAWN IS UNCERTAIN. PLEASE NOTIFY THE MICROBIOLOGY DEPARTMENT WITHIN ONE WEEK IF SPECIATION AND SENSITIVITIES ARE REQUIRED.     Note: Gram Stain Report Called to,Read Back By and Verified With: SOPHIA HAYFORD 10/13/13 AT 0200 RIDK     Performed at [facility]   Report Status 10/14/2013 FINAL   Final  CULTURE, BLOOD (ROUTINE X 2)     Status: None   Collection Time    10/12/13  1:06 AM      Result Value Ref Range Status   Specimen Description BLOOD RIGHT  ARM   Final   Special Requests BOTTLES DRAWN AEROBIC AND ANAEROBIC [facility]6CC EACH   Final   Culture  Setup Time     Final   Value: 10/12/2013 09:23     Performed at [facility]   Culture     Final   Value:        BLOOD CULTURE RECEIVED NO GROWTH TO DATE CULTURE WILL BE HELD FOR 5 DAYS BEFORE ISSUING A FINAL NEGATIVE REPORT     Performed at [facility]   Report Status PENDING   Incomplete  URINE CULTURE     Status: None   Collection Time    10/12/13 11:54 AM      Result Value Ref Range Status   Specimen Description URINE, CLEAN CATCH   Final   Special Requests NONE   Final   Culture  Setup Time     Final   Value: 10/12/2013 19:17     Performed at [facility]   Colony Count     Final   Value: 15,000 COLONIES/ML     Performed at [facility]   Culture     Final   Value: Multiple bacterial morphotypes present, none predominant. Suggest appropriate recollection if clinically indicated.     Performed at [facility]   Report Status 10/13/2013 FINAL   Final  SURGICAL PCR SCREEN     Status: None   Collection Time    10/13/13 10:09 AM      Result Value Ref Range Status   MRSA, PCR NEGATIVE  NEGATIVE Final   Staphylococcus aureus NEGATIVE  NEGATIVE Final   Comment:            The Xpert SA Assay (FDA     approved for NASAL specimens     in patients over 221 years of age),     is one component of     a comprehensive surveillance     program.  Test performance has     been validated by [facility] for patients greater     than or equal to 24 year old.     It is not intended     to diagnose infection nor to     guide or monitor treatment.  CULTURE, ROUTINE-ABSCESS     Status: None   Collection Time    10/13/13 11:30 AM      Result Value Ref Range Status   Specimen Description ABSCESS AXILLA RIGHT   Final   Special Requests PATIENT ON FOLLOWING ZOSYN   Final  Gram Stain     Final   Value: ABUNDANT WBC PRESENT,BOTH PMN AND MONONUCLEAR      NO SQUAMOUS EPITHELIAL CELLS SEEN     ABUNDANT GRAM POSITIVE COCCI IN PAIRS     Performed at [facility]   Culture     Final   Value: NO GROWTH 3 DAYS     Performed at [facility]   Report Status 10/16/2013 FINAL   Final  ANAEROBIC CULTURE     Status: None   Collection Time    10/13/13 11:30 AM      Result Value Ref Range Status   Specimen Description ABSCESS AXILLA RIGHT   Final   Special Requests PATIENT ON FOLLOWING ZOSYN   Final   Gram Stain     Final   Value: ABUNDANT WBC PRESENT,BOTH PMN AND MONONUCLEAR     ABUNDANT GRAM POSITIVE COCCI IN PAIRS     Performed at [facility]   Culture     Final   Value: NO ANAEROBES ISOLATED; CULTURE IN PROGRESS FOR 5 DAYS     Performed at [facility]   Report Status PENDING   Incomplete     Studies: No results found.  Scheduled Meds: . cephALEXin  500 mg Oral 3 times per day  . doxycycline  100 mg Oral Q12H  . enoxaparin (LOVENOX) injection  60 mg Subcutaneous Q24H   Continuous Infusions:    Principal Problem:   Cellulitis of upper arm Active Problems:   Cellulitis and abscess    Time spent: >35 minutes     Esperanza Sheets  [facility] Pager [phone]. If 7PM-7AM, please contact night-coverage at [email or web address], password [facility] 10/16/2013, 10:07 AM  LOS: 5 days

## 2013-10-16 NOTE — Progress Notes (Signed)
Agree Adama Ferber, MD, MPH, FACS Trauma: [phone] General Surgery: [phone]  

## 2013-10-16 NOTE — Progress Notes (Signed)
Patient's mother instructed of how to change patient dressing. Mother has no questions or concerns about changing dressing.

## 2013-10-16 NOTE — Progress Notes (Signed)
Patient ID: Sierra Palmer, female   DOB: 1990/11/14, 23 y.o.   MRN: [id number] 3 Days Post-Op  Subjective: Pt feels ok.  Pain well controlled with percocet for dressing changes.  Objective: Vital signs in last 24 hours: Temp:  [98.2 F (36.8 C)-98.5 F (36.9 C)] 98.2 F (36.8 C) (06/29 0521) Pulse Rate:  [78-93] 78 (06/29 0521) Resp:  [15-16] 16 (06/29 0521) BP: (113-130)/(65-86) 128/86 mmHg (06/29 0521) SpO2:  [93 %-99 %] 99 % (06/29 0521) Last BM Date: 10/14/13  Intake/Output from previous day: 06/28 0701 - 06/29 0700 In: 1282 [P.O.:1282] Out: -  Intake/Output this shift:    PE: Skin: left axillary erythema resolved.  Wound is clean and packed  Lab Results:  No results found for this basename: WBC, HGB, HCT, PLT,  in the last 72 hours BMET No results found for this basename: NA, K, CL, CO2, GLUCOSE, BUN, CREATININE, CALCIUM,  in the last 72 hours PT/INR No results found for this basename: LABPROT, INR,  in the last 72 hours CMP     Component Value Date/Time   NA 138 10/12/2013 0300   K 3.9 10/12/2013 0300   CL 100 10/12/2013 0300   CO2 23 10/12/2013 0300   GLUCOSE 103* 10/12/2013 0300   BUN 8 10/12/2013 0300   CREATININE 0.66 10/12/2013 0300   CALCIUM 9.1 10/12/2013 0300   PROT 7.1 10/12/2013 0300   ALBUMIN 3.1* 10/12/2013 0300   AST 14 10/12/2013 0300   ALT 11 10/12/2013 0300   ALKPHOS 78 10/12/2013 0300   BILITOT <0.2* 10/12/2013 0300   GFRNONAA >90 10/12/2013 0300   GFRAA >90 10/12/2013 0300   Lipase  No results found for this basename: lipase       Studies/Results: No results found.  Anti-infectives: Anti-infectives   Start     Dose/Rate Route Frequency Ordered Stop   10/15/13 1400  cephALEXin (KEFLEX) capsule 500 mg     500 mg Oral 3 times per day 10/15/13 0953     10/15/13 1100  doxycycline (VIBRA-TABS) tablet 100 mg     100 mg Oral Every 12 hours 10/15/13 0953     10/13/13 0800  vancomycin (VANCOCIN) 1,250 mg in sodium chloride 0.9 % 250 mL IVPB  Status:   Discontinued     1,250 mg 166.7 mL/hr over 90 Minutes Intravenous Every 8 hours 10/13/13 0740 10/15/13 0953   10/12/13 1200  piperacillin-tazobactam (ZOSYN) IVPB 3.375 g  Status:  Discontinued     3.375 g 12.5 mL/hr over 240 Minutes Intravenous Every 8 hours 10/12/13 1111 10/15/13 0953   10/12/13 0800  clindamycin (CLEOCIN) IVPB 600 mg  Status:  Discontinued     600 mg 100 mL/hr over 30 Minutes Intravenous 3 times per day 10/12/13 0240 10/12/13 1100   10/12/13 0115  clindamycin (CLEOCIN) IVPB 600 mg     600 mg 100 mL/hr over 30 Minutes Intravenous  Once 10/12/13 0109 10/12/13 0430       Assessment/Plan  1. POD 3, s/p left axillary abscess I&D  Plan: 1. Patient stable for dc home from our standpoint.  Would treat with several more days of abx at home. 2. [facility]   LOS: 5 days    OSBORNE,KELLY E 10/16/2013, 10:04 AM Pager: [phone]

## 2013-10-17 ENCOUNTER — Encounter (HOSPITAL_COMMUNITY): Payer: Self-pay | Admitting: General Surgery

## 2013-10-18 LAB — CULTURE, BLOOD (ROUTINE X 2): CULTURE: NO GROWTH

## 2013-10-19 LAB — ANAEROBIC CULTURE

## 2013-10-31 ENCOUNTER — Encounter (INDEPENDENT_AMBULATORY_CARE_PROVIDER_SITE_OTHER): Payer: Self-pay

## 2013-11-07 ENCOUNTER — Encounter (INDEPENDENT_AMBULATORY_CARE_PROVIDER_SITE_OTHER): Payer: Self-pay

## 2013-11-22 ENCOUNTER — Encounter (INDEPENDENT_AMBULATORY_CARE_PROVIDER_SITE_OTHER): Payer: Self-pay | Admitting: General Surgery

## 2016-01-19 IMAGING — US US EXTREM UP *R* LTD
1 series · 14 of 23 positions shown · non-contrast
Comparison: No priors.

CLINICAL DATA: Pain and swelling in the right axilla.

EXAM:
ULTRASOUND right UPPER EXTREMITY LIMITED
TECHNIQUE: Ultrasound examination of the upper extremity soft tissues was
performed in the area of clinical concern.

[Series 1: us extrem up *right* ltd · 0.07mm/px · 14 of 23 slices shown]
[im 1/23]
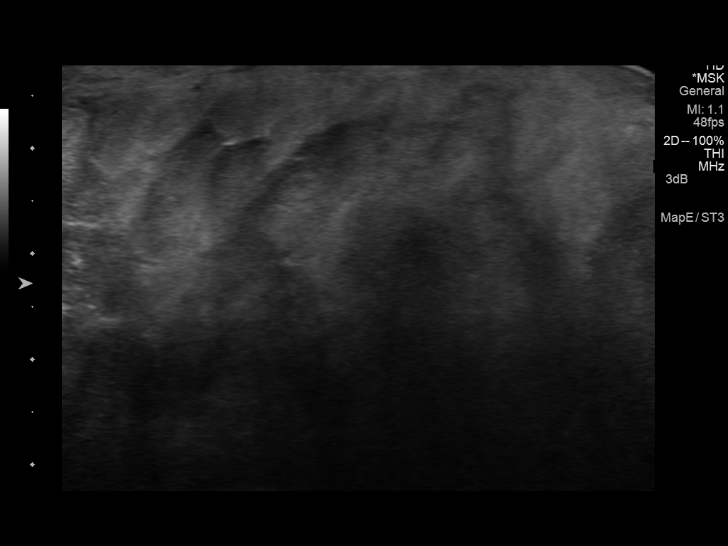
[im 3/23]
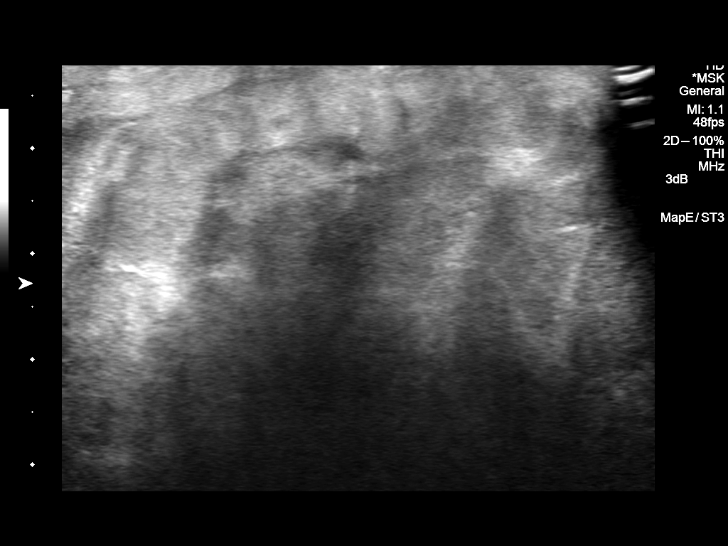
[im 5/23]
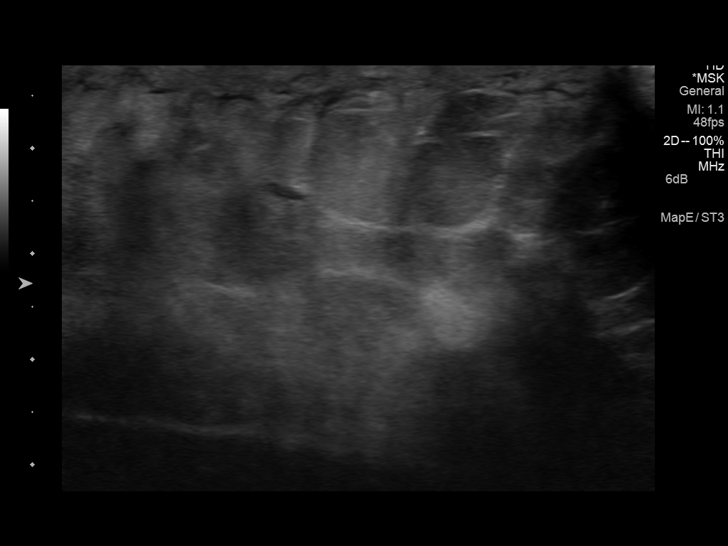
[im 6/23]
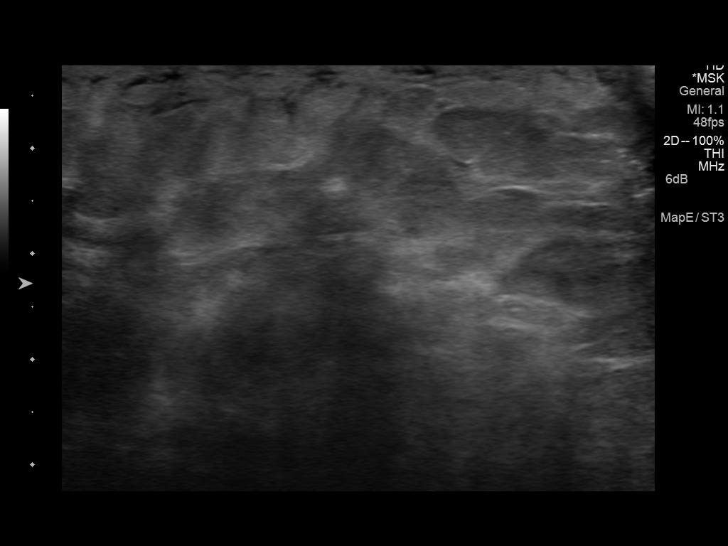
[im 8/23]
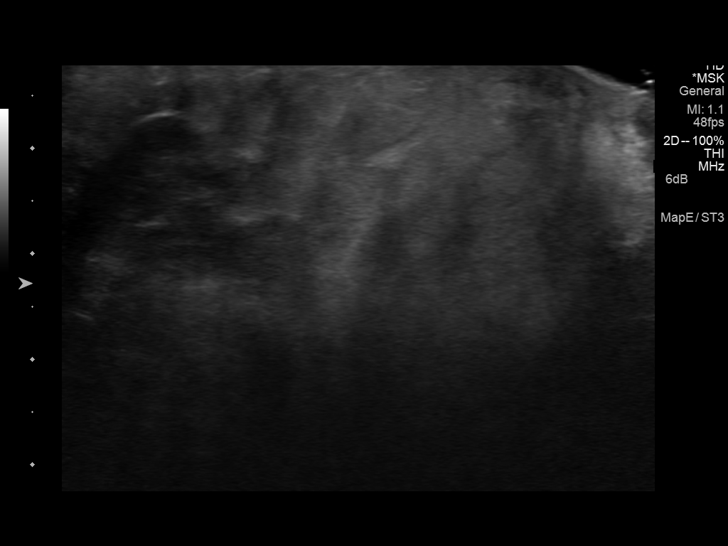
[im 10/23]
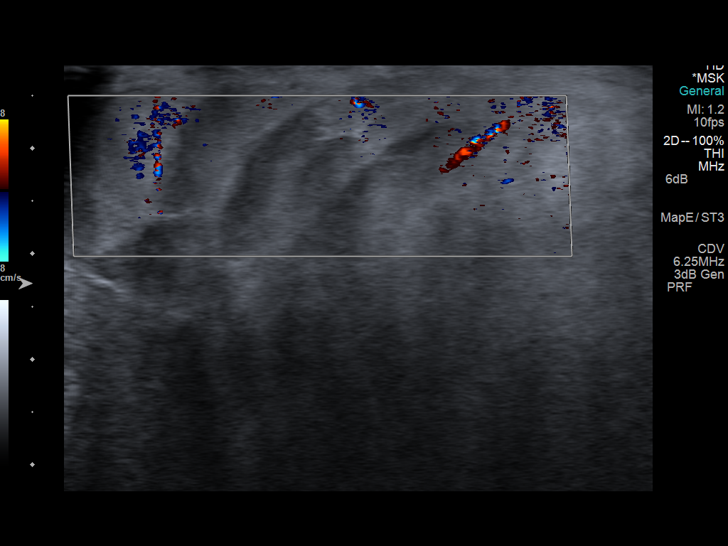
[im 11/23]
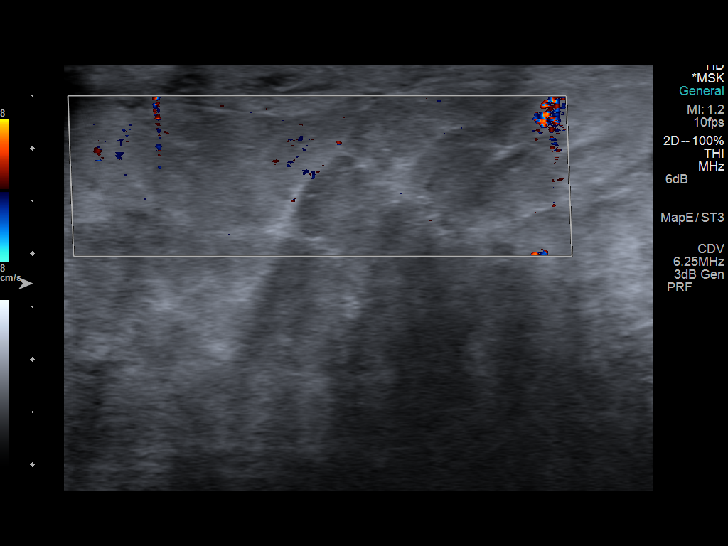
[im 13/23]
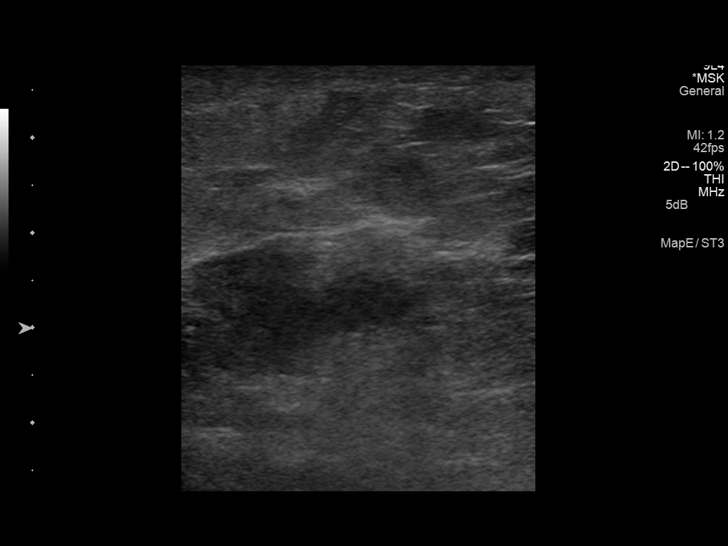
[im 14/23]
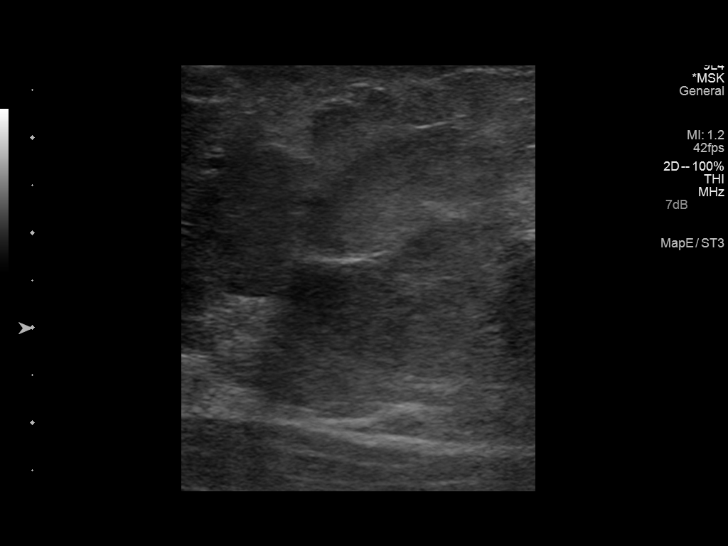
[im 16/23]
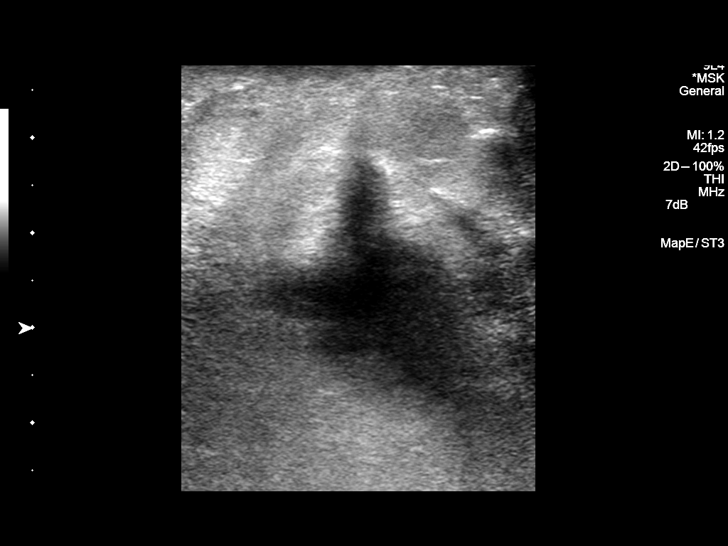
[im 18/23]
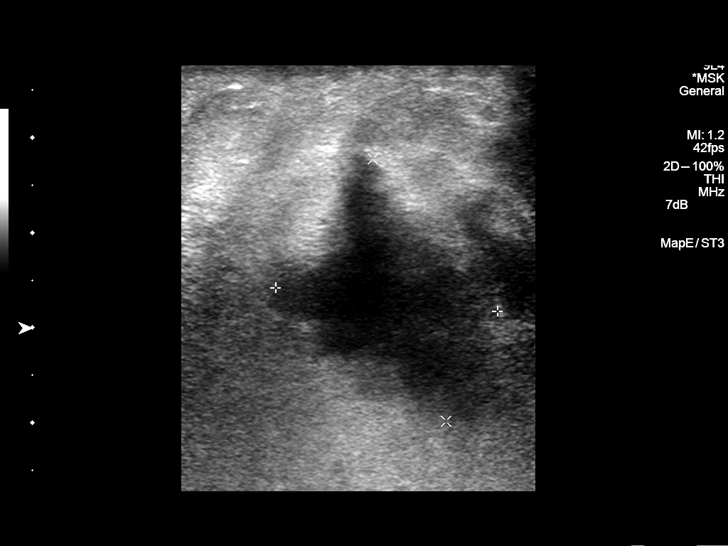
[im 19/23]
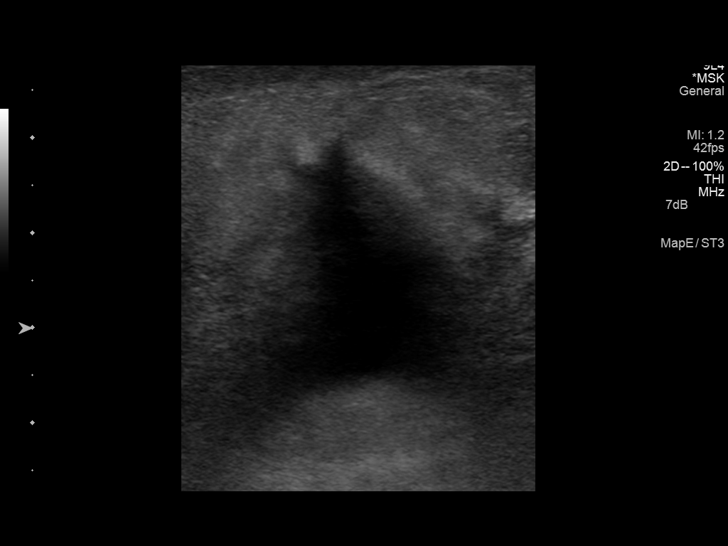
[im 21/23]
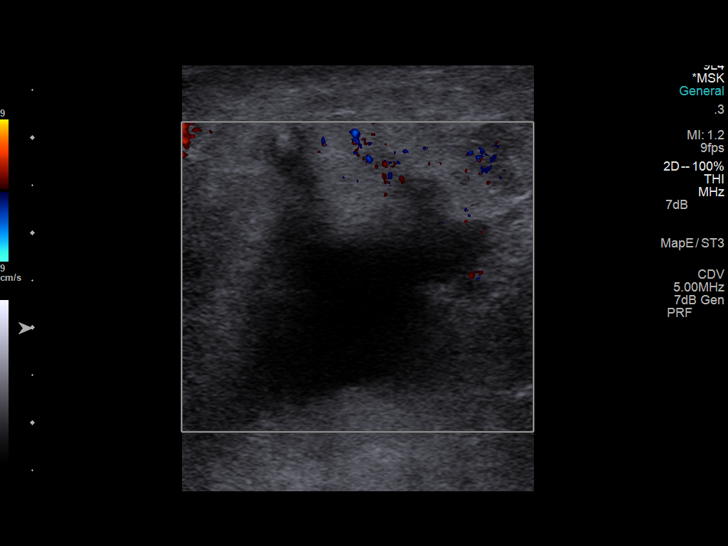
[im 23/23]
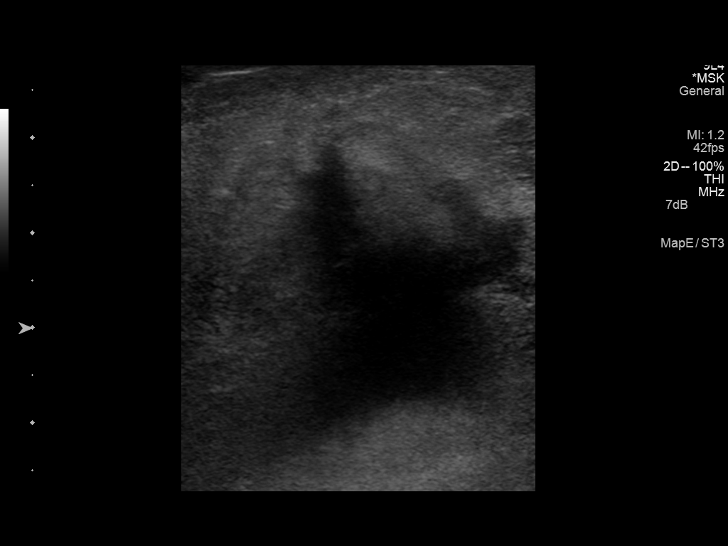

[14 of 23 positions shown; findings below may reference images not displayed]

FINDINGS: In the inner aspect of the upper arm within the subcutaneous tissues
there is a poorly defined area of hypoechogenicity that measures
approximately 2.4 x 2.9 cm with some increased through transmission,
which could represent abscess.
IMPRESSION: 1. 2.4 x 2.9 cm poorly defined fluid collection in the subcutaneous
fat of the medial aspect of the upper right arm is suspicious for
potential abscess.
